# Patient Record
Sex: Female | Born: 1955 | Race: White | Hispanic: No | Marital: Single | Smoking: Former smoker
Health system: Southern US, Community
[De-identification: ages and names within clinical notes are randomized; demographics above are authoritative.]

## PROBLEM LIST (undated history)

## (undated) DIAGNOSIS — Z87442 Personal history of urinary calculi: Secondary | ICD-10-CM

## (undated) DIAGNOSIS — K219 Gastro-esophageal reflux disease without esophagitis: Secondary | ICD-10-CM

## (undated) DIAGNOSIS — C7A8 Other malignant neuroendocrine tumors: Secondary | ICD-10-CM

## (undated) HISTORY — PX: KIDNEY STONE SURGERY: SHX686

---

## 2004-04-23 ENCOUNTER — Ambulatory Visit: Payer: Self-pay | Admitting: Obstetrics & Gynecology

## 2004-06-25 ENCOUNTER — Ambulatory Visit: Payer: Self-pay | Admitting: Orthopedic Surgery

## 2005-04-05 HISTORY — PX: OTHER SURGICAL HISTORY: SHX169

## 2015-04-06 HISTORY — PX: COLONOSCOPY: SHX174

## 2016-08-10 ENCOUNTER — Encounter: Payer: Self-pay | Admitting: *Deleted

## 2016-08-17 ENCOUNTER — Encounter: Payer: Self-pay | Admitting: General Surgery

## 2016-08-17 ENCOUNTER — Ambulatory Visit (INDEPENDENT_AMBULATORY_CARE_PROVIDER_SITE_OTHER): Payer: BC Managed Care – PPO | Admitting: General Surgery

## 2016-08-17 VITALS — BP 118/62 | HR 98 | Resp 14 | Ht 66.0 in | Wt 116.0 lb

## 2016-08-17 DIAGNOSIS — C7A8 Other malignant neuroendocrine tumors: Secondary | ICD-10-CM

## 2016-08-17 DIAGNOSIS — L729 Follicular cyst of the skin and subcutaneous tissue, unspecified: Secondary | ICD-10-CM | POA: Diagnosis not present

## 2016-08-17 DIAGNOSIS — R634 Abnormal weight loss: Secondary | ICD-10-CM

## 2016-08-17 DIAGNOSIS — C3491 Malignant neoplasm of unspecified part of right bronchus or lung: Secondary | ICD-10-CM | POA: Insufficient documentation

## 2016-08-17 HISTORY — PX: OTHER SURGICAL HISTORY: SHX169

## 2016-08-17 HISTORY — DX: Other malignant neuroendocrine tumors: C7A.8

## 2016-08-17 NOTE — Progress Notes (Signed)
Patient ID: Eileen Pratt, female   DOB: Apr 21, 1955, 61 y.o.   MRN: [id number]  Chief Complaint  Patient presents with  . Other    HPI Eileen Pratt is a 61 y.o. female here today for a evaluation of multiple skin nodules under her arms, chest wall and  inguinal areas. Patient states she first noticed  them about three weeks ago. She states no pain but some itchy. Patient states she has not had a appetite in the last month. She states she has been having a sharpe pain in her belly.  Lost 20 pounds in the last three years.  She has been seen in 2007 for sebaceous cyst. HPI  No past medical history on file.  Past Surgical History:  Procedure Laterality Date  . breast aspiriations Left 2007  . COLONOSCOPY  2017    No family history on file.  Social History Social History  Substance Use Topics  . Smoking status: Never Smoker  . Smokeless tobacco: Never Used  . Alcohol use No    No Known Allergies  No current outpatient prescriptions on file.   No current facility-administered medications for this visit.     Review of Systems Review of Systems  Constitutional: Positive for appetite change.  Respiratory: Negative.   Gastrointestinal: Positive for constipation.    Blood pressure 118/62, pulse 98, resp. rate 14, height '5\' 6"'$  (1.676 m), weight 116 lb (52.6 kg).  Physical Exam Physical Exam  Constitutional: She is oriented to person, place, and time. She appears well-developed and well-nourished.  HENT:  Head:    Cardiovascular: Normal rate, regular rhythm and normal heart sounds.   Pulses:      Dorsalis pedis pulses are 2+ on the right side, and 2+ on the left side.       Posterior tibial pulses are 2+ on the right side, and 2+ on the left side.  No edema   Pulmonary/Chest: Effort normal and breath sounds normal.        Abdominal: There is no hepatosplenomegaly. There is tenderness in the right upper quadrant and epigastric area. There is no rebound. No hernia.  Hernia confirmed negative in the ventral area.    Lymphadenopathy:    She has no cervical adenopathy.    She has no axillary adenopathy.       Right: No inguinal adenopathy present.       Left: No inguinal and no supraclavicular adenopathy present.  Neurological: She is alert and oriented to person, place, and time.  Skin: Skin is warm and dry.  Right breast lower quadrant   Right shoulder  Right anterior chest  Left posterior neck Right upper quadrant  1 cm  Left upper quadrant 1 cm  epigastric area Right groin  Left groin Left lower quadrant Left postal thigh Left posterrior axilla         Data Reviewed The patient reports that she had a colonoscopy last year at [facility]. No record is found on review of care every where. Weight during examination on 08/04/2016 with Kem Kays, M.D.: 120 pounds. The patient last recorded weight of 142 pounds when examined 3 years ago with Barnett Applebaum, M.D.  Assessment    Unexplained weight loss.  Postprandial pain.  Multiple simultaneous skin nodules concerning for metastatic cancer.    Plan    The patient was amenable to excision of the dominant lesion on the right anterior chest. 10 mL of 0.5% Xylocaine with 0.25% Marcaine with 1-200,000 of epinephrine was  utilized well tolerated. This was supplemented with 3 mL 1% plain Xylocaine. The area with overlying skin was excised through elliptical incision. Examination showed a hard, solid mass consistent with a likely tumor. This was sent in formalin for histology. The deep tissue was approximated with a 3-0. The skin was closed with a running 4-0 Prolene suture. Telfa and Tegaderm dressing applied.  We'll obtain baseline laboratory studies including a CBC, comprehensive metabolic panel, CEA and CA 19-9.  Off work note for this we provided.    HPI, Physical Exam, Assessment and Plan have been scribed under the direction and in the presence of Hervey Ard, MD.  Gaspar Cola,  CMA  I have completed the exam and reviewed the above documentation for accuracy and completeness.  I agree with the above.  Haematologist has been used and any errors in dictation or transcription are unintentional.  Hervey Ard, M.D., F.A.C.S.   Robert Bellow 08/17/2016, 8:24 PM

## 2016-08-18 ENCOUNTER — Ambulatory Visit: Payer: Self-pay | Admitting: Obstetrics & Gynecology

## 2016-08-18 LAB — CANCER ANTIGEN 19-9: CA 19-9: 21 U/mL (ref 0–35)

## 2016-08-18 LAB — CBC WITH DIFFERENTIAL/PLATELET
BASOS ABS: 0 10*3/uL (ref 0.0–0.2)
BASOS: 0 %
EOS (ABSOLUTE): 0.1 10*3/uL (ref 0.0–0.4)
Eos: 0 %
HEMOGLOBIN: 15 g/dL (ref 11.1–15.9)
Hematocrit: 43.5 % (ref 34.0–46.6)
IMMATURE GRANS (ABS): 0 10*3/uL (ref 0.0–0.1)
Immature Granulocytes: 0 %
LYMPHS ABS: 2 10*3/uL (ref 0.7–3.1)
LYMPHS: 14 %
MCH: 30.8 pg (ref 26.6–33.0)
MCHC: 34.5 g/dL (ref 31.5–35.7)
MCV: 89 fL (ref 79–97)
Monocytes Absolute: 1.8 10*3/uL — ABNORMAL HIGH (ref 0.1–0.9)
Monocytes: 12 %
NEUTROS ABS: 10.7 10*3/uL — AB (ref 1.4–7.0)
Neutrophils: 74 %
PLATELETS: 326 10*3/uL (ref 150–379)
RBC: 4.87 x10E6/uL (ref 3.77–5.28)
RDW: 13.4 % (ref 12.3–15.4)
WBC: 14.6 10*3/uL — ABNORMAL HIGH (ref 3.4–10.8)

## 2016-08-18 LAB — COMPREHENSIVE METABOLIC PANEL
ALBUMIN: 4 g/dL (ref 3.6–4.8)
ALT: 25 IU/L (ref 0–32)
AST: 30 IU/L (ref 0–40)
Albumin/Globulin Ratio: 1.5 (ref 1.2–2.2)
Alkaline Phosphatase: 145 IU/L — ABNORMAL HIGH (ref 39–117)
BILIRUBIN TOTAL: 0.5 mg/dL (ref 0.0–1.2)
BUN / CREAT RATIO: 16 (ref 12–28)
BUN: 10 mg/dL (ref 8–27)
CALCIUM: 10 mg/dL (ref 8.7–10.3)
CHLORIDE: 93 mmol/L — AB (ref 96–106)
CO2: 26 mmol/L (ref 18–29)
Creatinine, Ser: 0.64 mg/dL (ref 0.57–1.00)
GFR, EST AFRICAN AMERICAN: 111 mL/min/{1.73_m2} (ref >59)
GFR, EST NON AFRICAN AMERICAN: 97 mL/min/{1.73_m2} (ref >59)
GLOBULIN, TOTAL: 2.7 g/dL (ref 1.5–4.5)
Glucose: 105 mg/dL — ABNORMAL HIGH (ref 65–99)
POTASSIUM: 5.2 mmol/L (ref 3.5–5.2)
SODIUM: 138 mmol/L (ref 134–144)
TOTAL PROTEIN: 6.7 g/dL (ref 6.0–8.5)

## 2016-08-18 LAB — CEA: CEA: 127.4 ng/mL — AB (ref 0.0–4.7)

## 2016-08-19 ENCOUNTER — Other Ambulatory Visit: Payer: Self-pay | Admitting: *Deleted

## 2016-08-19 ENCOUNTER — Telehealth: Payer: Self-pay | Admitting: *Deleted

## 2016-08-19 DIAGNOSIS — C799 Secondary malignant neoplasm of unspecified site: Secondary | ICD-10-CM

## 2016-08-19 NOTE — Telephone Encounter (Signed)
Patient called the office wanting lab results and pathology results from Tuesday, 08-17-16.  Dr. Bary Castilla notified.   The patient will be contacted once pathology results are available.

## 2016-08-20 ENCOUNTER — Other Ambulatory Visit: Payer: Self-pay

## 2016-08-20 ENCOUNTER — Telehealth: Payer: Self-pay

## 2016-08-20 DIAGNOSIS — C7989 Secondary malignant neoplasm of other specified sites: Secondary | ICD-10-CM

## 2016-08-20 NOTE — Patient Instructions (Signed)
Called patient to inform her that [facility] denied our request for PET scan.  This scan has been cancelled.  However, we were able to get approval for CT Chest W Contrast and CT ABD/Pelvis W contrast.  These have been scheduled at [place] for Tuesday 5/22 at 10: 00 am.  The patient will need to pick up oral contrast on Monday, 5/21.  She will need to be NPO for four hours prior to scan on 5/22.  Arrival time for scan appointment is 9:45 am.  Advised patient to contact office Monday if she had any questions or concerns.

## 2016-08-20 NOTE — Telephone Encounter (Signed)
Spoke with patient about setting up her PET scan. The patient is scheduled for the scan at [facility] on 08/24/16 at 8:30 am. She will arrive by 8:00 am and have noting by mouth for 6 hours prior. The patient is aware of date, time, and instructions.

## 2016-08-24 ENCOUNTER — Ambulatory Visit
Admission: RE | Admit: 2016-08-24 | Discharge: 2016-08-24 | Disposition: A | Discharge status: Home / Self Care | Payer: BC Managed Care – PPO | Source: Ambulatory Visit | Attending: General Surgery | Admitting: General Surgery

## 2016-08-24 DIAGNOSIS — K869 Disease of pancreas, unspecified: Secondary | ICD-10-CM | POA: Diagnosis not present

## 2016-08-24 DIAGNOSIS — C771 Secondary and unspecified malignant neoplasm of intrathoracic lymph nodes: Secondary | ICD-10-CM | POA: Diagnosis not present

## 2016-08-24 DIAGNOSIS — R198 Other specified symptoms and signs involving the digestive system and abdomen: Secondary | ICD-10-CM | POA: Insufficient documentation

## 2016-08-24 DIAGNOSIS — C7989 Secondary malignant neoplasm of other specified sites: Secondary | ICD-10-CM | POA: Diagnosis not present

## 2016-08-24 DIAGNOSIS — C786 Secondary malignant neoplasm of retroperitoneum and peritoneum: Secondary | ICD-10-CM | POA: Diagnosis not present

## 2016-08-24 DIAGNOSIS — C7972 Secondary malignant neoplasm of left adrenal gland: Secondary | ICD-10-CM | POA: Diagnosis not present

## 2016-08-24 DIAGNOSIS — N1330 Unspecified hydronephrosis: Secondary | ICD-10-CM | POA: Insufficient documentation

## 2016-08-24 DIAGNOSIS — C801 Malignant (primary) neoplasm, unspecified: Secondary | ICD-10-CM | POA: Diagnosis not present

## 2016-08-24 DIAGNOSIS — C7971 Secondary malignant neoplasm of right adrenal gland: Secondary | ICD-10-CM | POA: Insufficient documentation

## 2016-08-24 HISTORY — DX: Other malignant neuroendocrine tumors: C7A.8

## 2016-08-24 MED ORDER — IOPAMIDOL (ISOVUE-300) INJECTION 61%
100.0000 mL | Freq: Once | INTRAVENOUS | Status: AC | PRN
  Administered 2016-08-24: 100 mL via INTRAVENOUS

## 2016-08-25 ENCOUNTER — Encounter: Payer: Self-pay | Admitting: Hematology and Oncology

## 2016-08-25 ENCOUNTER — Inpatient Hospital Stay: Payer: BC Managed Care – PPO | Attending: Hematology and Oncology | Admitting: Hematology and Oncology

## 2016-08-25 VITALS — BP 112/71 | HR 103 | Temp 97.7°F | Ht 67.0 in | Wt 115.1 lb

## 2016-08-25 DIAGNOSIS — M79632 Pain in left forearm: Secondary | ICD-10-CM | POA: Insufficient documentation

## 2016-08-25 DIAGNOSIS — Z87891 Personal history of nicotine dependence: Secondary | ICD-10-CM | POA: Diagnosis not present

## 2016-08-25 DIAGNOSIS — C7A090 Malignant carcinoid tumor of the bronchus and lung: Secondary | ICD-10-CM

## 2016-08-25 DIAGNOSIS — R531 Weakness: Secondary | ICD-10-CM | POA: Diagnosis not present

## 2016-08-25 DIAGNOSIS — R748 Abnormal levels of other serum enzymes: Secondary | ICD-10-CM

## 2016-08-25 DIAGNOSIS — C3491 Malignant neoplasm of unspecified part of right bronchus or lung: Secondary | ICD-10-CM

## 2016-08-25 DIAGNOSIS — C7972 Secondary malignant neoplasm of left adrenal gland: Secondary | ICD-10-CM | POA: Insufficient documentation

## 2016-08-25 DIAGNOSIS — C7971 Secondary malignant neoplasm of right adrenal gland: Secondary | ICD-10-CM | POA: Diagnosis not present

## 2016-08-25 DIAGNOSIS — M898X9 Other specified disorders of bone, unspecified site: Secondary | ICD-10-CM

## 2016-08-25 DIAGNOSIS — R229 Localized swelling, mass and lump, unspecified: Secondary | ICD-10-CM

## 2016-08-25 DIAGNOSIS — C7931 Secondary malignant neoplasm of brain: Secondary | ICD-10-CM | POA: Diagnosis not present

## 2016-08-25 DIAGNOSIS — M79662 Pain in left lower leg: Secondary | ICD-10-CM

## 2016-08-25 DIAGNOSIS — R5383 Other fatigue: Secondary | ICD-10-CM | POA: Diagnosis not present

## 2016-08-25 DIAGNOSIS — R630 Anorexia: Secondary | ICD-10-CM | POA: Diagnosis not present

## 2016-08-25 DIAGNOSIS — R634 Abnormal weight loss: Secondary | ICD-10-CM | POA: Diagnosis not present

## 2016-08-25 DIAGNOSIS — T380X5A Adverse effect of glucocorticoids and synthetic analogues, initial encounter: Secondary | ICD-10-CM | POA: Insufficient documentation

## 2016-08-25 DIAGNOSIS — B370 Candidal stomatitis: Secondary | ICD-10-CM | POA: Diagnosis not present

## 2016-08-25 DIAGNOSIS — Z79899 Other long term (current) drug therapy: Secondary | ICD-10-CM | POA: Insufficient documentation

## 2016-08-25 DIAGNOSIS — C781 Secondary malignant neoplasm of mediastinum: Secondary | ICD-10-CM | POA: Diagnosis not present

## 2016-08-25 DIAGNOSIS — C7951 Secondary malignant neoplasm of bone: Secondary | ICD-10-CM | POA: Insufficient documentation

## 2016-08-25 DIAGNOSIS — R7989 Other specified abnormal findings of blood chemistry: Secondary | ICD-10-CM | POA: Insufficient documentation

## 2016-08-25 DIAGNOSIS — C7989 Secondary malignant neoplasm of other specified sites: Secondary | ICD-10-CM

## 2016-08-25 DIAGNOSIS — C7A1 Malignant poorly differentiated neuroendocrine tumors: Secondary | ICD-10-CM | POA: Insufficient documentation

## 2016-08-25 NOTE — Progress Notes (Signed)
Patient here today for CT results. 

## 2016-08-25 NOTE — Progress Notes (Signed)
[facility] day:  08/25/2016  Chief Complaint: Eileen Pratt is a 61 y.o. female with a neuroendocrine tumor who is referred in consultation by Dr. Hervey Ard for assessment and management.  HPI: The patient notes a 15 pack year smoking history (1/2 pack/day x 30 years).  She stopped smoking when her mother died.  She denies any history of COPD/emphysema.  She states that 1 year ago, she began to have pains under her rib cage which would "come and go".  Over the past 4-5 weeks, she began to have lumps pop up on her chest, head, and abdomen. The first lump that appeared was on her right anterior chest.  Lumps are non-painful and slightly pruritic.    She was seen by Dr. Kem Kays on 08/04/2016 and referred to Dr. Hervey Ard on 08/17/2016.  She underwent excision of the dominant lesion on the right anterior chest.  Biopsy of the nodule on 08/17/2016 revealed a neuroendocrine carcinoma. The tumor was positive for chromogranin, neuron specific enolase (NSE) and pancytokertin AE1/3 stains. The tumor was negative for cytokeratin 7, cytokeratin 20, Melan-A and S-100 stains. TTF-1 was diffusely positive. 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  Labs on 08/17/2016 revealed a hematocrit of 43.5, hemoglobin 15.0, MCV 89, platelets 326,000, white count 14,600 with an ANC of 10,700.  Comprehensive metabolic panel was notable for an alkaline phosphatase of 145.  CEA was 127.4.  CA 19-9 was 21.  Chest, abdomen, and pelvic CT scan on 08/24/2016 revealed extensive subcutaneous nodules in the chest, abdomen and pelvis consistent metastatic neuroendocrine tumor.  There was a small nodule in the RIGHT upper lobe and larger nodule in the RIGHT hilum consistent with PRIMARY NEOPLASM versus metastatic  disease.  There was mild nodal metastasis in the mediastinum and along the pericardium.  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  There was extensive peritoneal nodular metastasis and retroperitoneal nodule metastasis.  There was subcapsular lesions along the kidney and liver likely represents serosal metastasis.  There was mild hydronephrosis of pole of LEFT kidney is favored related to tumor within the LEFT hilum.  There was potential cortical infiltrate in the LEFT upper pole.  There was no evidence of bowel obstruction. There were multiple peritoneal lesions associated with the terminal ileum and descending colon.  There were multiple cystic lesions within the pancreas body and tail. 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  Symptomatically, she notes pain in her left forearm and left tibia without trauma for the past 3 weeks.  She has had a 20 pound weight loss in the past year.  Appetite is poor.  She denies any headache or neurologic symptoms.  She is up-to-date on her mammogram (yearly at [facility]) and colonoscopy (2 years ago).   Past Medical History:  Diagnosis Date  . 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 breast aspiriations Left 2007  . chest wall mass  08/17/2016   NEUROENDOCRINE CARCINOMA, FAVOR METASTASIS  . COLONOSCOPY  2017    History reviewed. No pertinent family history.  Social History:  reports that she has never smoked. She has never used smokeless tobacco. She reports that she does not drink alcohol or use drugs.  She has a 15 pack year smoking history (1/2 pack/day x 30 years).  She stopped  smoking when her mother died.  She has 6 brothers and no sisters.  She has no children.  She works at the [employer].  She lives in [place], [place].  The patient is alone today.  Allergies: No Known Allergies  Current Medications: No current outpatient prescriptions on file.   No  current facility-administered medications for this visit.     Review of Systems:  GENERAL:  Fatigue.  No fevers or sweats.  Weight loss of 20-25 pounds in the past year. PERFORMANCE STATUS (ECOG):  1 HEENT:  No visual changes, runny nose, sore throat, mouth sores or tenderness. Lungs: No shortness of breath or cough.  No hemoptysis. Cardiac:  No chest pain, palpitations, orthopnea, or PND. GI:  Poor appetite.  No nausea, vomiting, diarrhea, constipation, melena or hematochezia. GU:  No urgency, frequency, dysuria, or hematuria. Musculoskeletal:  Pain in left forearm and left tibia without trauma.  No muscle tenderness. Extremities:  No swelling. Skin:  No rashes or skin changes. Neuro:  No headache, numbness or weakness, balance or coordination issues. Endocrine:  No diabetes, thyroid issues, hot flashes or night sweats. Psych:  No mood changes, depression or anxiety. Pain: Pain in left forearm and tibia. Review of systems:  All other systems reviewed and found to be negative.  Physical Exam: Blood pressure 112/71, pulse (!) 103, temperature 97.7 F (36.5 C), temperature source Tympanic, weight 115 lb 1.3 oz (52.2 kg). GENERAL:  Thin woman sitting comfortably in the exam room in no acute distress. MENTAL STATUS:  Alert and oriented to person, place and time. HEAD:  Long gray hair.  Normocephalic, atraumatic, face symmetric, no Cushingoid features. EYES:  Hazel eyes.  Pupils equal round and reactive to light and accomodation.  No conjunctivitis or scleral icterus. ENT:  Oropharynx clear without lesion.  Tongue normal. Mucous membranes moist.  RESPIRATORY:  Clear to auscultation without rales, wheezes or rhonchi. CARDIOVASCULAR:  Regular rate and rhythm without murmur, rub or gallop. ABDOMEN:  Soft, non-tender, with active bowel sounds, and no hepatosplenomegaly.  No masses. SKIN:  Right upper anterior chest wall incision C/D/I.  Upper back with 1.5 cm ruddy nodule.  Two 1-1/2 cm neck  lesions.  Scalp with clustered lesions.  Four abdominal nodules.  Right groin nodule.  No rashes, ulcers or lesions. EXTREMITIES: No edema, no skin discoloration or tenderness.  No palpable cords. LYMPH NODES: No palpable cervical, supraclavicular, axillary or inguinal adenopathy  NEUROLOGICAL: Alert & oriented, cranial nerves II-XII intact; motor strength 5/5 throughout; sensation intact; finger to nose and RAM normal; able to walk heel to toe; negative Rhomberg; no clonus or Babinski.  Difficulty getting up from a squat. PSYCH:  Appropriate.   No visits with results within 3 Day(s) from this visit.  Latest known visit with results is:  Office Visit on 08/17/2016  Component Date Value Ref Range Status  . CA 19-9 08/17/2016 21  0 - 35 U/mL Final   Roche ECLIA methodology  . CEA 08/17/2016 127.4* 0.0 - 4.7 ng/mL Final   Comment:        Roche ECLIA methodology       Nonsmokers  <3.9                                      Smokers     <5.6   . Glucose 08/17/2016 105* 65 - 99 mg/dL Final  . BUN 08/17/2016 10  8 - 27 mg/dL Final  . Creatinine, Ser 08/17/2016 0.64  0.57 - 1.00 mg/dL Final  . GFR calc non Af Amer 08/17/2016 97  >59 mL/min/1.73 Final  . GFR calc Af Amer 08/17/2016 111  >59 mL/min/1.73 Final  . BUN/Creatinine Ratio 08/17/2016 16  12 - 28 Final  . Sodium 08/17/2016 138  134 - 144 mmol/L Final  . Potassium 08/17/2016 5.2  3.5 - 5.2 mmol/L Final  . Chloride 08/17/2016 93* 96 - 106 mmol/L Final  . CO2 08/17/2016 26  18 - 29 mmol/L Final  . Calcium 08/17/2016 10.0  8.7 - 10.3 mg/dL Final  . Total Protein 08/17/2016 6.7  6.0 - 8.5 g/dL Final  . Albumin 08/17/2016 4.0  3.6 - 4.8 g/dL Final  . Globulin, Total 08/17/2016 2.7  1.5 - 4.5 g/dL Final  . Albumin/Globulin Ratio 08/17/2016 1.5  1.2 - 2.2 Final  . Bilirubin Total 08/17/2016 0.5  0.0 - 1.2 mg/dL Final  . Alkaline Phosphatase 08/17/2016 145* 39 - 117 IU/L Final  . AST 08/17/2016 30  0 - 40 IU/L Final  . ALT 08/17/2016 25  0 - 32  IU/L Final  . WBC 08/17/2016 14.6* 3.4 - 10.8 x10E3/uL Final  . RBC 08/17/2016 4.87  3.77 - 5.28 x10E6/uL Final  . Hemoglobin 08/17/2016 15.0  11.1 - 15.9 g/dL Final  . Hematocrit 08/17/2016 43.5  34.0 - 46.6 % Final  . MCV 08/17/2016 89  79 - 97 fL Final  . MCH 08/17/2016 30.8  26.6 - 33.0 pg Final  . MCHC 08/17/2016 34.5  31.5 - 35.7 g/dL Final  . RDW 08/17/2016 13.4  12.3 - 15.4 % Final  . Platelets 08/17/2016 326  150 - 379 x10E3/uL Final  . Neutrophils 08/17/2016 74  Not Estab. % Final  . Lymphs 08/17/2016 14  Not Estab. % Final  . Monocytes 08/17/2016 12  Not Estab. % Final  . Eos 08/17/2016 0  Not Estab. % Final  . Basos 08/17/2016 0  Not Estab. % Final  . Neutrophils Absolute 08/17/2016 10.7* 1.4 - 7.0 x10E3/uL Final  . Lymphocytes Absolute 08/17/2016 2.0  0.7 - 3.1 x10E3/uL Final  . Monocytes Absolute 08/17/2016 1.8* 0.1 - 0.9 x10E3/uL Final  . EOS (ABSOLUTE) 08/17/2016 0.1  0.0 - 0.4 x10E3/uL Final  . Basophils Absolute 08/17/2016 0.0  0.0 - 0.2 x10E3/uL Final  . Immature Granulocytes 08/17/2016 0  Not Estab. % Final  . Immature Grans (Abs) 08/17/2016 0.0  0.0 - 0.1 x10E3/uL Final    Assessment:  Eileen Pratt is a 61 y.o. female with extensive stage small cell lung cancer.  She presented with a 20 pound weight loss in the past year and multiple SQ skin nodules.  Biopsy of the dominant right anterior chest nodule on 08/17/2016 revealed a high grade neuroendocrine carcinoma. The tumor was positive for chromogranin, neuron specific enolase (NSE) and pancytokertin AE1/3 stains. The tumor was negative for cytokeratin 7, cytokeratin 20, Melan-A and S-100 stains. TTF-1 was diffusely positive. 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  Chest, abdomen, and pelvic CT on 08/24/2016 revealed extensive subcutaneous nodules in the chest, abdomen and pelvis consistent metastatic neuroendocrine tumor.  There was a  small nodule in the RIGHT upper lobe and larger nodule in the RIGHT hilum consistent with PRIMARY NEOPLASM versus metastatic disease.  There was mild nodal metastasis in the mediastinum and along the pericardium.  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  There was  extensive peritoneal nodular metastasis and retroperitoneal nodule metastasis.  There was subcapsular lesions along the kidney and liver likely represents serosal metastasis.  There was mild hydronephrosis of pole of LEFT kidney is favored related to tumor within the LEFT hilum.  There was potential cortical infiltrate in the LEFT upper pole.  There were multiple peritoneal lesions associated with the terminal ileum and descending colon.  There were multiple cystic lesions within the pancreas body and tail.    CEA was 127.4 and CA 19-9 was 21 on 08/17/2016.  Symptomatically, she notes pain in her left forearm and left tibia for the past 3 weeks.  She has had a 20 pound weight loss in the past year.  Appetite is poor.  She denies any headache or neurologic symptoms.  Exam reveals multiple SQ nodules.  Alkaline phosphatase is elevated.  Plan: 1.  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  Discuss additional staging studies including head MRI and bone scan.  Concern for metastatic bone disease given elevated alkaline phosphatase and bone pain.  If bone scan +, will check plain films of weight bearing bones secondary to risk for fracture.  Patient would be a candidate for Xgeva and possible radiation to weight bearing bones to prevent fracture.  Discuss treatment is palliative.  Discuss treatment with carboplatin and etoposide every 3 weeks for 4-6 cycles.  Potential side effects of treatment reviewed including myelosuppression, infection, hair loss, nausea/vomiting, electrolyte wasting (potassium and magnesium), and secondary leukemia (etoposide).  Discuss chemotherapy  teaching.  Discuss need for port-a-cath placement.  2.  Schedule head MRI ASAP. 3.  Schedule bone scan ASAP. 4.  Present at tumor board tomorrow. 5.  Schedule chemotherapy teaching: carboplatin and etoposide. 6.  RTC on Tuesday or Wednsesday next week for MD assessment, labs (CBC with diff, CMP, Mg), and cycle #1 carboplatin and etoposide.  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 The largest was a 2 cm right anterior inferior frontal gyrus metastasis with surrounding cerebral edema but no significant mass effect. At least 5 additional subcentimeter brain metastases are identified without edema or mass effect.  There was indeterminate metastatic involvement of the pituitary gland.  There was widespread skull metastases, including an expansile 2 x 3 cm left vertex metastasis with underlying dural involvement. There was questionable tiny dural metastasis.  The patient was contacted on 08/27/2016 regarding the results of her head MRI.  She denied headache or neurologic symptoms.  She was started on Decadron 4 mg po q 6 hours as well as omeprazole for GI prophylaxis.  Radiation oncology was contacted.  She will be seen after [facility] (08/30/2016).   Lequita Asal, MD  08/25/2016, 3:34 PM

## 2016-08-26 ENCOUNTER — Inpatient Hospital Stay: Payer: BC Managed Care – PPO

## 2016-08-26 NOTE — Patient Instructions (Signed)
Etoposide, VP-16 injection What is this medicine? ETOPOSIDE, VP-16 (e toe POE side) is a chemotherapy drug. It is used to treat testicular cancer, lung cancer, and other cancers. This medicine may be used for other purposes; ask your health care provider or pharmacist if you have questions. COMMON BRAND NAME(S): Etopophos, Toposar, VePesid What should I tell my health care provider before I take this medicine? They need to know if you have any of these conditions: -infection -kidney disease -liver disease -low blood counts, like low white cell, platelet, or red cell counts -an unusual or allergic reaction to etoposide, other medicines, foods, dyes, or preservatives -pregnant or trying to get pregnant -breast-feeding How should I use this medicine? This medicine is for infusion into a vein. It is administered in a hospital or clinic by a specially trained health care professional. Talk to your pediatrician regarding the use of this medicine in children. Special care may be needed. Overdosage: If you think you have taken too much of this medicine contact a poison control center or emergency room at once. NOTE: This medicine is only for you. Do not share this medicine with others. What if I miss a dose? It is important not to miss your dose. Call your doctor or health care professional if you are unable to keep an appointment. What may interact with this medicine? -aspirin -certain medications for seizures like carbamazepine, phenobarbital, phenytoin, valproic acid -cyclosporine -levamisole -warfarin This list may not describe all possible interactions. Give your health care provider a list of all the medicines, herbs, non-prescription drugs, or dietary supplements you use. Also tell them if you smoke, drink alcohol, or use illegal drugs. Some items may interact with your medicine. What should I watch for while using this medicine? Visit your doctor for checks on your progress. This drug  may make you feel generally unwell. This is not uncommon, as chemotherapy can affect healthy cells as well as cancer cells. Report any side effects. Continue your course of treatment even though you feel ill unless your doctor tells you to stop. In some cases, you may be given additional medicines to help with side effects. Follow all directions for their use. Call your doctor or health care professional for advice if you get a fever, chills or sore throat, or other symptoms of a cold or flu. Do not treat yourself. This drug decreases your body's ability to fight infections. Try to avoid being around people who are sick. This medicine may increase your risk to bruise or bleed. Call your doctor or health care professional if you notice any unusual bleeding. Talk to your doctor about your risk of cancer. You may be more at risk for certain types of cancers if you take this medicine. Do not become pregnant while taking this medicine or for at least 6 months after stopping it. Women should inform their doctor if they wish to become pregnant or think they might be pregnant. Women of child-bearing potential will need to have a negative pregnancy test before starting this medicine. There is a potential for serious side effects to an unborn child. Talk to your health care professional or pharmacist for more information. Do not breast-feed an infant while taking this medicine. Men must use a latex condom during sexual contact with a woman while taking this medicine and for at least 4 months after stopping it. A latex condom is needed even if you have had a vasectomy. Contact your doctor right away if your partner becomes pregnant. Do   not donate sperm while taking this medicine and for at least 4 months after you stop taking this medicine. Men should inform their doctors if they wish to father a child. This medicine may lower sperm counts. What side effects may I notice from receiving this medicine? Side effects that  you should report to your doctor or health care professional as soon as possible: -allergic reactions like skin rash, itching or hives, swelling of the face, lips, or tongue -low blood counts - this medicine may decrease the number of white blood cells, red blood cells and platelets. You may be at increased risk for infections and bleeding. -signs of infection - fever or chills, cough, sore throat, pain or difficulty passing urine -signs of decreased platelets or bleeding - bruising, pinpoint red spots on the skin, black, tarry stools, blood in the urine -signs of decreased red blood cells - unusually weak or tired, fainting spells, lightheadedness -breathing problems -changes in vision -mouth or throat sores or ulcers -pain, redness, swelling or irritation at the injection site -pain, tingling, numbness in the hands or feet -redness, blistering, peeling or loosening of the skin, including inside the mouth -seizures -vomiting Side effects that usually do not require medical attention (report to your doctor or health care professional if they continue or are bothersome): -diarrhea -hair loss -loss of appetite -nausea -stomach pain This list may not describe all possible side effects. Call your doctor for medical advice about side effects. You may report side effects to FDA at 1-800-FDA-1088. Where should I keep my medicine? This drug is given in a hospital or clinic and will not be stored at home. NOTE: This sheet is a summary. It may not cover all possible information. If you have questions about this medicine, talk to your doctor, pharmacist, or health care provider.  2018 Elsevier/Gold Standard (2015-03-14 11:53:23) Carboplatin injection What is this medicine? CARBOPLATIN (KAR boe pla tin) is a chemotherapy drug. It targets fast dividing cells, like cancer cells, and causes these cells to die. This medicine is used to treat ovarian cancer and many other cancers. This medicine may be  used for other purposes; ask your health care provider or pharmacist if you have questions. COMMON BRAND NAME(S): Paraplatin What should I tell my health care provider before I take this medicine? They need to know if you have any of these conditions: -blood disorders -hearing problems -kidney disease -recent or ongoing radiation therapy -an unusual or allergic reaction to carboplatin, cisplatin, other chemotherapy, other medicines, foods, dyes, or preservatives -pregnant or trying to get pregnant -breast-feeding How should I use this medicine? This drug is usually given as an infusion into a vein. It is administered in a hospital or clinic by a specially trained health care professional. Talk to your pediatrician regarding the use of this medicine in children. Special care may be needed. Overdosage: If you think you have taken too much of this medicine contact a poison control center or emergency room at once. NOTE: This medicine is only for you. Do not share this medicine with others. What if I miss a dose? It is important not to miss a dose. Call your doctor or health care professional if you are unable to keep an appointment. What may interact with this medicine? -medicines for seizures -medicines to increase blood counts like filgrastim, pegfilgrastim, sargramostim -some antibiotics like amikacin, gentamicin, neomycin, streptomycin, tobramycin -vaccines Talk to your doctor or health care professional before taking any of these medicines: -acetaminophen -aspirin -ibuprofen -ketoprofen -naproxen  This list may not describe all possible interactions. Give your health care provider a list of all the medicines, herbs, non-prescription drugs, or dietary supplements you use. Also tell them if you smoke, drink alcohol, or use illegal drugs. Some items may interact with your medicine. What should I watch for while using this medicine? Your condition will be monitored carefully while you are  receiving this medicine. You will need important blood work done while you are taking this medicine. This drug may make you feel generally unwell. This is not uncommon, as chemotherapy can affect healthy cells as well as cancer cells. Report any side effects. Continue your course of treatment even though you feel ill unless your doctor tells you to stop. In some cases, you may be given additional medicines to help with side effects. Follow all directions for their use. Call your doctor or health care professional for advice if you get a fever, chills or sore throat, or other symptoms of a cold or flu. Do not treat yourself. This drug decreases your body's ability to fight infections. Try to avoid being around people who are sick. This medicine may increase your risk to bruise or bleed. Call your doctor or health care professional if you notice any unusual bleeding. Be careful brushing and flossing your teeth or using a toothpick because you may get an infection or bleed more easily. If you have any dental work done, tell your dentist you are receiving this medicine. Avoid taking products that contain aspirin, acetaminophen, ibuprofen, naproxen, or ketoprofen unless instructed by your doctor. These medicines may hide a fever. Do not become pregnant while taking this medicine. Women should inform their doctor if they wish to become pregnant or think they might be pregnant. There is a potential for serious side effects to an unborn child. Talk to your health care professional or pharmacist for more information. Do not breast-feed an infant while taking this medicine. What side effects may I notice from receiving this medicine? Side effects that you should report to your doctor or health care professional as soon as possible: -allergic reactions like skin rash, itching or hives, swelling of the face, lips, or tongue -signs of infection - fever or chills, cough, sore throat, pain or difficulty passing  urine -signs of decreased platelets or bleeding - bruising, pinpoint red spots on the skin, black, tarry stools, nosebleeds -signs of decreased red blood cells - unusually weak or tired, fainting spells, lightheadedness -breathing problems -changes in hearing -changes in vision -chest pain -high blood pressure -low blood counts - This drug may decrease the number of white blood cells, red blood cells and platelets. You may be at increased risk for infections and bleeding. -nausea and vomiting -pain, swelling, redness or irritation at the injection site -pain, tingling, numbness in the hands or feet -problems with balance, talking, walking -trouble passing urine or change in the amount of urine Side effects that usually do not require medical attention (report to your doctor or health care professional if they continue or are bothersome): -hair loss -loss of appetite -metallic taste in the mouth or changes in taste This list may not describe all possible side effects. Call your doctor for medical advice about side effects. You may report side effects to FDA at 1-800-FDA-1088. Where should I keep my medicine? This drug is given in a hospital or clinic and will not be stored at home. NOTE: This sheet is a summary. It may not cover all possible information. If you have  questions about this medicine, talk to your doctor, pharmacist, or health care provider.  2018 Elsevier/Gold Standard (2007-06-27 14:38:05)

## 2016-08-27 ENCOUNTER — Ambulatory Visit
Admission: RE | Admit: 2016-08-27 | Discharge: 2016-08-27 | Disposition: A | Discharge status: Home / Self Care | Payer: BC Managed Care – PPO | Source: Ambulatory Visit | Attending: Hematology and Oncology | Admitting: Hematology and Oncology

## 2016-08-27 ENCOUNTER — Other Ambulatory Visit: Payer: Self-pay | Admitting: Hematology and Oncology

## 2016-08-27 ENCOUNTER — Telehealth: Payer: Self-pay | Admitting: Hematology and Oncology

## 2016-08-27 DIAGNOSIS — C7931 Secondary malignant neoplasm of brain: Secondary | ICD-10-CM | POA: Diagnosis not present

## 2016-08-27 DIAGNOSIS — R748 Abnormal levels of other serum enzymes: Secondary | ICD-10-CM | POA: Insufficient documentation

## 2016-08-27 DIAGNOSIS — M898X9 Other specified disorders of bone, unspecified site: Secondary | ICD-10-CM

## 2016-08-27 DIAGNOSIS — C7951 Secondary malignant neoplasm of bone: Secondary | ICD-10-CM | POA: Insufficient documentation

## 2016-08-27 DIAGNOSIS — C7A1 Malignant poorly differentiated neuroendocrine tumors: Secondary | ICD-10-CM | POA: Diagnosis present

## 2016-08-27 MED ORDER — OMEPRAZOLE 20 MG PO CPDR: 20.0000 mg | DELAYED_RELEASE_CAPSULE | Freq: Every day | ORAL | 1 refills | Status: AC

## 2016-08-27 MED ORDER — GADOBENATE DIMEGLUMINE 529 MG/ML IV SOLN
10.0000 mL | Freq: Once | INTRAVENOUS | Status: AC | PRN
  Administered 2016-08-27: 10 mL via INTRAVENOUS

## 2016-08-27 MED ORDER — DEXAMETHASONE 4 MG PO TABS: 4.0000 mg | ORAL_TABLET | Freq: Four times a day (QID) | ORAL | 0 refills | Status: DC

## 2016-08-27 NOTE — Telephone Encounter (Signed)
Re:  Head MRI results  Called patient with head MRI results.  Patient has brain metastasis with edema.    Decadron 4 mg po q 6 hours prescribed as well as omeprazole 20 mg a day for GI prophylaxis. Side effects reviewed. Prescription sent to her pharmacy.  She has an appointment with radiation oncology on Tuesday, 08/31/2016, in the afternoon.  She is to call if she has any problems over the holiday weekend.   Lequita Asal, MD

## 2016-08-27 NOTE — Progress Notes (Signed)
START ON PATHWAY REGIMEN - Small Cell Lung     A cycle is every 21 days:     Etoposide      Carboplatin   **Always confirm dose/schedule in your pharmacy ordering system**    Patient Characteristics: Extensive Stage, First Line Stage Grouping: Extensive AJCC T Category: TX AJCC N Category: NX AJCC M Category: Staged < 8th Ed. AJCC 8 Stage Grouping: IVB Line of therapy: First Line Would you be surprised if this patient died  in the next year? I would NOT be surprised if this patient died in the next year  Intent of Therapy: Non-Curative / Palliative Intent, Discussed with Patient

## 2016-08-29 ENCOUNTER — Telehealth: Payer: Self-pay | Admitting: General Surgery

## 2016-08-29 NOTE — Telephone Encounter (Signed)
Patient taking decadron as requested for brain mets.  Meets w/ radiation oncology after Memorial Day.  Will stop by the office to get the sutures from skin nodule excision out that day after radiation appointment.  Discussed need for port.   Will look to schedule week of June 4.

## 2016-08-30 DIAGNOSIS — C7931 Secondary malignant neoplasm of brain: Secondary | ICD-10-CM | POA: Insufficient documentation

## 2016-08-30 NOTE — Progress Notes (Addendum)
[facility] day:  08/31/2016  Chief Complaint: Eileen Pratt is a 61 y.o. female with extensive stage small cell lung cancer who is seen for assessment prior to cycle #1 carboplatin and etoposide.  HPI: The patient was last seen in the medical oncology clinic clinic on 08/25/2016.  At that time, she was seen for initial consultation.  She had noted the rapid development of numerous cutaneous lesions.  Excisional biopsy on 08/17/2016 revealed high grade neuroendocrine tumor.  Tumor was TTF1+ and was consistent with small cell lung cancer.  Chest, abdomen, and pelvic CT scans on 08/25/2016 revealed extensive subcutaneous nodules, a small nodule in the right upper lobe and larger nodule in the right hilum, nodal metastasis in the mediastinum and along the pericardium.  There were bilateral adrenal metastasis, extensive peritoneal nodular metastasis and retroperitoneal nodule metastasis, subcapsular lesions along the kidney and liver likely represents serosal metastasis. There were multiple cystic lesions within the pancreas body and tail.  Head MRI on 08/27/2016 revealed brain metastases. The largest was a 2 cm right anterior inferior frontal gyrus metastasis with surrounding cerebral edema but no significant mass effect. At least 5 additional subcentimeter brain metastases are identified without edema or mass effect.  There was indeterminate metastatic involvement of the pituitary gland.  There was widespread skull metastases, including an expansile 2 x 3 cm left vertex metastasis with underlying dural involvement. There was questionable tiny dural metastasis.  She was started on Decadron 4 mg po q 6 hours on 08/27/2016.  Bone scan is scheduled for 09/02/2016.  Symptomatically, she feels weak.  She is not eating or sleeping well.   Past Medical History:  Diagnosis Date  . 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 breast aspiriations Left 2007  . chest wall mass  08/17/2016   NEUROENDOCRINE CARCINOMA, FAVOR METASTASIS  . COLONOSCOPY  2017    History reviewed. No pertinent family history.  Social History:  reports that she has never smoked. She has never used smokeless tobacco. She reports that she does not drink alcohol or use drugs.  She has a 15 pack year smoking history (1/2 pack/day x 30 years).  She stopped smoking when her mother died.  She has 6 brothers and no sisters.  She has no children.  She previously worked at the [employer].  She can no longer work.  She lives in [place], [place].  The patient is accompanied by Hoy Morn, her boyfriend of 42 years, today.  Allergies: No Known Allergies  Current Medications: Current Outpatient Prescriptions  Medication Sig Dispense Refill  . dexamethasone (DECADRON) 4 MG tablet Take 1 tablet (4 mg total) by mouth every 6 (six) hours. 40 tablet 0  . omeprazole (PRILOSEC) 20 MG capsule Take 1 capsule (20 mg total) by mouth daily. 30 capsule 1   No current facility-administered medications for this visit.     Review of Systems:  GENERAL:  Fatigue.  No fevers or sweats.  Weight loss of 4 pounds since last visit. PERFORMANCE STATUS (ECOG):  2 HEENT:  No visual changes, runny nose, sore throat, mouth sores or tenderness. Lungs: No shortness of breath or cough.  No hemoptysis. Cardiac:  No chest pain, palpitations, orthopnea, or PND. GI:  Poor appetite.  No nausea, vomiting, diarrhea, constipation, melena or hematochezia. GU:  No urgency, frequency, dysuria, or hematuria. Musculoskeletal:  Pain in left forearm and left tibia without  trauma.  No muscle tenderness. Extremities:  No swelling. Skin:  No rashes or skin changes. Neuro:  No headache, numbness or weakness, balance or coordination issues. Endocrine:  No diabetes, thyroid issues, hot flashes or night sweats. Psych:  No mood changes, depression or anxiety.   Poor sleep. Pain: Pain in left forearm and tibia. Review of systems:  All other systems reviewed and found to be negative.  Physical Exam: Blood pressure 104/65, pulse 87, temperature 97.6 F (36.4 C), temperature source Tympanic, resp. rate 18, weight 111 lb 5 oz (50.5 kg). GENERAL:  Thin woman sitting comfortably in the exam room in no acute distress. MENTAL STATUS:  Alert and oriented to person, place and time. HEAD:  Long gray hair.  Normocephalic, atraumatic, face symmetric, no Cushingoid features. EYES:  Hazel eyes.  Pupils equal round and reactive to light and accomodation.  No conjunctivitis or scleral icterus. ENT:  Thrush.  Tongue normal. Mucous membranes moist.  RESPIRATORY:  Clear to auscultation without rales, wheezes or rhonchi. CARDIOVASCULAR:  Regular rate and rhythm without murmur, rub or gallop. ABDOMEN:  Soft, non-tender, with active bowel sounds, and no hepatosplenomegaly.  No masses. SKIN: Upper back with 1.5 cm ruddy nodule.  Two 1-1/2 cm neck lesions.  Scalp with clustered lesions.  Abdominal nodules.  EXTREMITIES: No edema, no skin discoloration or tenderness.  No palpable cords. LYMPH NODES: No palpable cervical, supraclavicular, axillary or inguinal adenopathy  NEUROLOGICAL: Appropriate. PSYCH:  Appropriate.   Appointment on 08/31/2016  Component Date Value Ref Range Status  . WBC 08/31/2016 23.9* 3.6 - 11.0 K/uL Final  . RBC 08/31/2016 5.08  3.80 - 5.20 MIL/uL Final  . Hemoglobin 08/31/2016 15.6  12.0 - 16.0 g/dL Final  . HCT 08/31/2016 45.2  35.0 - 47.0 % Final  . MCV 08/31/2016 89.0  80.0 - 100.0 fL Final  . MCH 08/31/2016 30.7  26.0 - 34.0 pg Final  . MCHC 08/31/2016 34.5  32.0 - 36.0 g/dL Final  . RDW 08/31/2016 13.2  11.5 - 14.5 % Final  . Platelets 08/31/2016 449* 150 - 440 K/uL Final  . Neutrophils Relative % 08/31/2016 87  % Final  . Neutro Abs 08/31/2016 20.9* 1.4 - 6.5 K/uL Final  . Lymphocytes Relative 08/31/2016 6  % Final  . Lymphs Abs  08/31/2016 1.3  1.0 - 3.6 K/uL Final  . Monocytes Relative 08/31/2016 6  % Final  . Monocytes Absolute 08/31/2016 1.4* 0.2 - 0.9 K/uL Final  . Eosinophils Relative 08/31/2016 1  % Final  . Eosinophils Absolute 08/31/2016 0.2  0 - 0.7 K/uL Final  . Basophils Relative 08/31/2016 0  % Final  . Basophils Absolute 08/31/2016 0.0  0 - 0.1 K/uL Final  . Sodium 08/31/2016 134* 135 - 145 mmol/L Final  . Potassium 08/31/2016 5.3* 3.5 - 5.1 mmol/L Final  . Chloride 08/31/2016 96* 101 - 111 mmol/L Final  . CO2 08/31/2016 29  22 - 32 mmol/L Final  . Glucose, Bld 08/31/2016 134* 65 - 99 mg/dL Final  . BUN 08/31/2016 16  6 - 20 mg/dL Final  . Creatinine, Ser 08/31/2016 0.82  0.44 - 1.00 mg/dL Final  . Calcium 08/31/2016 10.0  8.9 - 10.3 mg/dL Final  . Total Protein 08/31/2016 7.6  6.5 - 8.1 g/dL Final  . Albumin 08/31/2016 3.6  3.5 - 5.0 g/dL Final  . AST 08/31/2016 35  15 - 41 U/L Final  . ALT 08/31/2016 23  14 - 54 U/L Final  . Alkaline Phosphatase 08/31/2016  139* 38 - 126 U/L Final  . Total Bilirubin 08/31/2016 0.4  0.3 - 1.2 mg/dL Final  . GFR calc non Af Amer 08/31/2016 >60  >60 mL/min Final  . GFR calc Af Amer 08/31/2016 >60  >60 mL/min Final   Comment: (NOTE) The eGFR has been calculated using the CKD EPI equation. This calculation has not been validated in all clinical situations. eGFR's persistently <60 mL/min signify possible Chronic Kidney Disease.   . Anion gap 08/31/2016 9  5 - 15 Final  . Magnesium 08/31/2016 2.1  1.7 - 2.4 mg/dL Final    Assessment:  ETHELINE GEPPERT is a 61 y.o. female with extensive stage small cell lung cancer.  She presented with a 20 pound weight loss in the past year and multiple SQ skin nodules.  Biopsy of the dominant right anterior chest nodule on 08/17/2016 revealed a high grade neuroendocrine carcinoma. The tumor was positive for chromogranin, neuron specific enolase (NSE) and pancytokertin AE1/3 stains. The tumor was negative for cytokeratin 7, cytokeratin  20, Melan-A and S-100 stains. TTF-1 was diffusely positive. 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  Chest, abdomen, and pelvic CT on 08/24/2016 revealed extensive subcutaneous nodules in the chest, abdomen and pelvis consistent metastatic neuroendocrine tumor.  There was a small nodule in the RIGHT upper lobe and larger nodule in the RIGHT hilum consistent with PRIMARY NEOPLASM versus metastatic disease.  There was mild nodal metastasis in the mediastinum and along the pericardium.  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  There was extensive peritoneal nodular metastasis and retroperitoneal nodule metastasis.  There was subcapsular lesions along the kidney and liver likely represents serosal metastasis.  There was mild hydronephrosis of pole of LEFT kidney is favored related to tumor within the LEFT hilum.  There was potential cortical infiltrate in the LEFT upper pole.  There were multiple peritoneal lesions associated with the terminal ileum and descending colon.  There were multiple cystic lesions within the pancreas body and tail.    Head MRI on 08/27/2016 revealed brain metastases. The largest was a 2 cm right anterior inferior frontal gyrus metastasis with surrounding cerebral edema but no significant mass effect. At least 5 additional subcentimeter brain metastases are identified without edema or mass effect.  There was indeterminate metastatic involvement of the pituitary gland.  There was widespread skull metastases, including an expansile 2 x 3 cm left vertex metastasis with underlying dural involvement. There was questionable tiny dural metastasis.  CEA was 127.4 and CA 19-9 was 21 on 08/17/2016.  Symptomatically, she has pain in her left forearm and left tibia.  She has had a 20 pounds.  Appetite is poor.  Exam reveals multiple SQ nodules.  She has thrush secondary to Decadron.  Alkaline phosphatase is  elevated.  Plan: 1.  Labs today:  CBC with diff, CMP, Mg. 2.  Discuss results of CT scan and head MRI.  She has extensive stage small cell lung cancer.  Discuss need to address CNS metastasis.  She will see Dr. Baruch Gouty today to discuss whole brain radiation.  Discuss plan for bone scan to assess bone disease.  If any weight bearing lesions +, will obtain plain films. 3.  Discuss pain management. 4.  Discuss management of thrush. 5.  No chemotherapy today. 6.  Rx: Nystatin and hydrocodone/acetaminophen. 7.  Consult with Dr. Baruch Gouty today (seen in clinic). 8.  Call patient with results of bone scan. 9.  Patient to be seen  for f/u after completion of radiation (pt to call)   Lequita Asal, MD  08/31/2016, 10:40 AM

## 2016-08-31 ENCOUNTER — Ambulatory Visit
Admission: RE | Admit: 2016-08-31 | Discharge: 2016-08-31 | Disposition: A | Discharge status: Home / Self Care | Payer: BC Managed Care – PPO | Source: Ambulatory Visit | Attending: Radiation Oncology | Admitting: Radiation Oncology

## 2016-08-31 ENCOUNTER — Inpatient Hospital Stay (HOSPITAL_BASED_OUTPATIENT_CLINIC_OR_DEPARTMENT_OTHER): Payer: BC Managed Care – PPO | Admitting: Hematology and Oncology

## 2016-08-31 ENCOUNTER — Encounter: Payer: Self-pay | Admitting: Hematology and Oncology

## 2016-08-31 ENCOUNTER — Inpatient Hospital Stay: Payer: BC Managed Care – PPO

## 2016-08-31 ENCOUNTER — Other Ambulatory Visit: Payer: Self-pay | Admitting: Hematology and Oncology

## 2016-08-31 VITALS — BP 104/65 | HR 87 | Temp 97.6°F | Resp 18 | Wt 111.3 lb

## 2016-08-31 DIAGNOSIS — R748 Abnormal levels of other serum enzymes: Secondary | ICD-10-CM

## 2016-08-31 DIAGNOSIS — Z7189 Other specified counseling: Secondary | ICD-10-CM

## 2016-08-31 DIAGNOSIS — M79662 Pain in left lower leg: Secondary | ICD-10-CM

## 2016-08-31 DIAGNOSIS — C7971 Secondary malignant neoplasm of right adrenal gland: Secondary | ICD-10-CM

## 2016-08-31 DIAGNOSIS — C7972 Secondary malignant neoplasm of left adrenal gland: Secondary | ICD-10-CM | POA: Diagnosis not present

## 2016-08-31 DIAGNOSIS — R7989 Other specified abnormal findings of blood chemistry: Secondary | ICD-10-CM | POA: Diagnosis not present

## 2016-08-31 DIAGNOSIS — C781 Secondary malignant neoplasm of mediastinum: Secondary | ICD-10-CM

## 2016-08-31 DIAGNOSIS — R531 Weakness: Secondary | ICD-10-CM | POA: Diagnosis not present

## 2016-08-31 DIAGNOSIS — C7931 Secondary malignant neoplasm of brain: Secondary | ICD-10-CM

## 2016-08-31 DIAGNOSIS — T380X5A Adverse effect of glucocorticoids and synthetic analogues, initial encounter: Secondary | ICD-10-CM

## 2016-08-31 DIAGNOSIS — R5383 Other fatigue: Secondary | ICD-10-CM | POA: Diagnosis not present

## 2016-08-31 DIAGNOSIS — C3490 Malignant neoplasm of unspecified part of unspecified bronchus or lung: Secondary | ICD-10-CM

## 2016-08-31 DIAGNOSIS — R634 Abnormal weight loss: Secondary | ICD-10-CM | POA: Diagnosis not present

## 2016-08-31 DIAGNOSIS — G893 Neoplasm related pain (acute) (chronic): Secondary | ICD-10-CM

## 2016-08-31 DIAGNOSIS — C3491 Malignant neoplasm of unspecified part of right bronchus or lung: Secondary | ICD-10-CM

## 2016-08-31 DIAGNOSIS — Z87891 Personal history of nicotine dependence: Secondary | ICD-10-CM

## 2016-08-31 DIAGNOSIS — Z79899 Other long term (current) drug therapy: Secondary | ICD-10-CM

## 2016-08-31 DIAGNOSIS — R630 Anorexia: Secondary | ICD-10-CM | POA: Diagnosis not present

## 2016-08-31 DIAGNOSIS — M79632 Pain in left forearm: Secondary | ICD-10-CM

## 2016-08-31 DIAGNOSIS — C7A1 Malignant poorly differentiated neuroendocrine tumors: Secondary | ICD-10-CM

## 2016-08-31 DIAGNOSIS — C7951 Secondary malignant neoplasm of bone: Secondary | ICD-10-CM

## 2016-08-31 DIAGNOSIS — R229 Localized swelling, mass and lump, unspecified: Secondary | ICD-10-CM

## 2016-08-31 DIAGNOSIS — C7A090 Malignant carcinoid tumor of the bronchus and lung: Secondary | ICD-10-CM | POA: Diagnosis not present

## 2016-08-31 DIAGNOSIS — M898X9 Other specified disorders of bone, unspecified site: Secondary | ICD-10-CM

## 2016-08-31 DIAGNOSIS — B370 Candidal stomatitis: Secondary | ICD-10-CM

## 2016-08-31 LAB — COMPREHENSIVE METABOLIC PANEL
ALT: 23 U/L (ref 14–54)
AST: 35 U/L (ref 15–41)
Albumin: 3.6 g/dL (ref 3.5–5.0)
Alkaline Phosphatase: 139 U/L — ABNORMAL HIGH (ref 38–126)
Anion gap: 9 (ref 5–15)
BUN: 16 mg/dL (ref 6–20)
CO2: 29 mmol/L (ref 22–32)
Calcium: 10 mg/dL (ref 8.9–10.3)
Chloride: 96 mmol/L — ABNORMAL LOW (ref 101–111)
Creatinine, Ser: 0.82 mg/dL (ref 0.44–1.00)
GFR calc Af Amer: >60 mL/min (ref >60)
GFR calc non Af Amer: >60 mL/min (ref >60)
Glucose, Bld: 134 mg/dL — ABNORMAL HIGH (ref 65–99)
Potassium: 5.3 mmol/L — ABNORMAL HIGH (ref 3.5–5.1)
Sodium: 134 mmol/L — ABNORMAL LOW (ref 135–145)
Total Bilirubin: 0.4 mg/dL (ref 0.3–1.2)
Total Protein: 7.6 g/dL (ref 6.5–8.1)

## 2016-08-31 LAB — CBC WITH DIFFERENTIAL/PLATELET
Basophils Absolute: 0 10*3/uL (ref 0–0.1)
Basophils Relative: 0 %
Eosinophils Absolute: 0.2 10*3/uL (ref 0–0.7)
Eosinophils Relative: 1 %
HCT: 45.2 % (ref 35.0–47.0)
Hemoglobin: 15.6 g/dL (ref 12.0–16.0)
Lymphocytes Relative: 6 %
Lymphs Abs: 1.3 10*3/uL (ref 1.0–3.6)
MCH: 30.7 pg (ref 26.0–34.0)
MCHC: 34.5 g/dL (ref 32.0–36.0)
MCV: 89 fL (ref 80.0–100.0)
Monocytes Absolute: 1.4 10*3/uL — ABNORMAL HIGH (ref 0.2–0.9)
Monocytes Relative: 6 %
Neutro Abs: 20.9 10*3/uL — ABNORMAL HIGH (ref 1.4–6.5)
Neutrophils Relative %: 87 %
Platelets: 449 10*3/uL — ABNORMAL HIGH (ref 150–440)
RBC: 5.08 MIL/uL (ref 3.80–5.20)
RDW: 13.2 % (ref 11.5–14.5)
WBC: 23.9 10*3/uL — ABNORMAL HIGH (ref 3.6–11.0)

## 2016-08-31 LAB — MAGNESIUM: Magnesium: 2.1 mg/dL (ref 1.7–2.4)

## 2016-08-31 MED ORDER — HYDROCODONE-ACETAMINOPHEN 5-325 MG PO TABS: 1.0000 | ORAL_TABLET | Freq: Four times a day (QID) | ORAL | 0 refills | Status: DC | PRN

## 2016-08-31 MED ORDER — NYSTATIN 100000 UNIT/ML MT SUSP: 5.0000 mL | Freq: Four times a day (QID) | OROMUCOSAL | 0 refills | Status: DC

## 2016-08-31 NOTE — Consult Note (Signed)
NEW PATIENT EVALUATION  Name: Eileen Pratt  MRN: [id number]  Date:   08/31/2016     DOB: 1955-10-22   This 61 y.o. female patient presents to the clinic for initial evaluation of stage IV lung cancer with brain metastasis.  REFERRING PHYSICIAN: No ref. provider found  CHIEF COMPLAINT: No chief complaint on file.   DIAGNOSIS: There were no encounter diagnoses.   PREVIOUS INVESTIGATIONS:  Clinical notes reviewed Pathology report reviewed CT scans of chest abdomen and pelvis as well as MRI scan of the brain reviewed Case presented at weekly tumor conference  HPI: Patient is a 61 year old female who presented with rapid progression of multiple cutaneous lesions. 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 Tumor was TTF-1 positive consistent with small cell lung cancer. CT scan showed extensive subcutaneous nodules a small nodule in the right upper lobe and larger right hilar adenopathy. There was also bilateral adrenal metastasis extensive peritoneal nodular metastasis. She had an MRI scan performed on May 25 showing multiple brain metastasis at least 6 the largest being 2 cm and right anterior-inferior frontal gyral region. There is also involvement of the pituitary gland and widespread skull metastasis and dural involvement. She has been started on Decadron is feeling improved. I been asked to evaluate the patient for possible palliative radiation therapy to her whole brain. She has no change in visual fields no focal neurologic deficits.  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    PAST SURGICAL HISTORY:  Past Surgical History:  Procedure Laterality Date  . breast aspiriations Left 2007  . chest wall mass  08/17/2016   NEUROENDOCRINE CARCINOMA, FAVOR METASTASIS  . COLONOSCOPY  2017    FAMILY HISTORY:  family history is not on file.  SOCIAL HISTORY:  reports that she has never smoked. She has never used smokeless tobacco. She reports that she does not drink alcohol or use drugs.  ALLERGIES: Patient has no known allergies.  MEDICATIONS:  Current Outpatient Prescriptions  Medication Sig Dispense Refill  . dexamethasone (DECADRON) 4 MG tablet Take 1 tablet (4 mg total) by mouth every 6 (six) hours. 40 tablet 0  . HYDROcodone-acetaminophen (NORCO) 5-325 MG tablet Take 1 tablet by mouth every 6 (six) hours as needed for moderate pain. 40 tablet 0  . nystatin (MYCOSTATIN) 100000 UNIT/ML suspension Take 5 mLs (500,000 Units total) by mouth 4 (four) times daily. Swish and spit 60 mL 0  . omeprazole (PRILOSEC) 20 MG capsule Take 1 capsule (20 mg total) by mouth daily. 30 capsule 1   No current facility-administered medications for this encounter.     ECOG PERFORMANCE STATUS:  1 - Symptomatic but completely ambulatory  REVIEW OF SYSTEMS: Patient continues to lose weight is quite weak. Patient does have subcutaneous nodules on her skull and throughout her body consistent with known diagnosis.  Patient denies any weight loss, fatigue, weakness, fever, chills or night sweats. Patient denies any loss of vision, blurred vision. Patient denies any ringing  of the ears or hearing loss. No irregular heartbeat. Patient denies heart murmur or history of fainting. Patient denies any chest pain or pain radiating to her upper extremities. Patient denies any shortness of breath, difficulty breathing at night, cough or hemoptysis. Patient denies any swelling in the lower legs. Patient denies any nausea vomiting, vomiting of blood, or coffee ground material in the vomitus. Patient denies any  stomach pain. Patient states has had normal bowel movements no significant constipation or diarrhea. Patient denies any dysuria, hematuria or significant nocturia. Patient denies any problems walking, swelling in the joints or loss  of balance. Patient denies any skin changes, loss of hair or loss of weight. Patient denies any excessive worrying or anxiety or significant depression. Patient denies any problems with insomnia. Patient denies excessive thirst, polyuria, polydipsia. Patient denies any swollen glands, patient denies easy bruising or easy bleeding. Patient denies any recent infections, allergies or URI. Patient "s visual fields have not changed significantly in recent time.    PHYSICAL EXAM: There were no vitals taken for this visit. Crude visual fields are within normal range motor sensory and DTR levels are equal and symmetric in the upper lower extremities proprioception is intact. She has multiple subcutaneous nodules throughout her entire skin. Well-developed well-nourished patient in NAD. HEENT reveals PERLA, EOMI, discs not visualized.  Oral cavity is clear. No oral mucosal lesions are identified. Neck is clear without evidence of cervical or supraclavicular adenopathy. Lungs are clear to A&P. Cardiac examination is essentially unremarkable with regular rate and rhythm without murmur rub or thrill. Abdomen is benign with no organomegaly or masses noted. Motor sensory and DTR levels are equal and symmetric in the upper and lower extremities. Cranial nerves II through XII are grossly intact. Proprioception is intact. No peripheral adenopathy or edema is identified. No motor or sensory levels are noted. Crude visual fields are within normal range.  LABORATORY DATA: Pathology reports reviewed    RADIOLOGY RESULTS: CT scan chest abdomen and pelvis as well as brain MRI scan all reviewed PET CT is pending   IMPRESSION: Widespread metastatic disease from extensive stage small cell lung cancer in 61 year old female with multiple brain metastasis  PLAN: At this time like to start whole brain radiation therapy. Would plan on delivering 3000 cGy in 10 fractions. This should also treat her subcutaneous nodules as well as  calvarium metastasis. Risks and benefits of treatment including hair loss fatigue possible cognitive decline alteration of blood counts all were discussed in detail with the patient. She seems to comprehend my treatment plan well. I have personally ordered and scheduled CT simulation for tomorrow. I discussed the case personally with medical oncology.  I would like to take this opportunity to thank you for allowing me to participate in the care of your patient.Armstead Peaks., MD

## 2016-08-31 NOTE — Progress Notes (Signed)
Patient here today for follow up regarding lung cancer with brain mets.  Here to discuss plan of treatment.  Patient states she is weak.  Not eating or sleeping well.

## 2016-08-31 NOTE — Telephone Encounter (Signed)
Spoke with patient about scheduling her port placement. The patient is scheduled for a port placement at [facility] on 09/06/16. She will pre admit by phone. Surgery instructions reviewed with the patient and she is aware of date and instructions.

## 2016-09-01 ENCOUNTER — Other Ambulatory Visit: Payer: Self-pay | Admitting: General Surgery

## 2016-09-01 ENCOUNTER — Ambulatory Visit
Admission: RE | Admit: 2016-09-01 | Discharge: 2016-09-01 | Disposition: A | Discharge status: Home / Self Care | Payer: BC Managed Care – PPO | Source: Ambulatory Visit | Attending: Radiation Oncology | Admitting: Radiation Oncology

## 2016-09-01 DIAGNOSIS — R634 Abnormal weight loss: Secondary | ICD-10-CM | POA: Insufficient documentation

## 2016-09-01 DIAGNOSIS — C7A1 Malignant poorly differentiated neuroendocrine tumors: Secondary | ICD-10-CM | POA: Insufficient documentation

## 2016-09-01 DIAGNOSIS — C7951 Secondary malignant neoplasm of bone: Secondary | ICD-10-CM | POA: Insufficient documentation

## 2016-09-01 DIAGNOSIS — R531 Weakness: Secondary | ICD-10-CM | POA: Diagnosis not present

## 2016-09-01 DIAGNOSIS — C7931 Secondary malignant neoplasm of brain: Secondary | ICD-10-CM | POA: Diagnosis present

## 2016-09-01 DIAGNOSIS — Z510 Encounter for antineoplastic radiation therapy: Secondary | ICD-10-CM | POA: Insufficient documentation

## 2016-09-01 DIAGNOSIS — C3491 Malignant neoplasm of unspecified part of right bronchus or lung: Secondary | ICD-10-CM

## 2016-09-02 ENCOUNTER — Encounter: Admission: RE | Admit: 2016-09-02 | Payer: BC Managed Care – PPO | Source: Ambulatory Visit

## 2016-09-02 ENCOUNTER — Ambulatory Visit: Admission: RE | Admit: 2016-09-02 | Payer: BC Managed Care – PPO | Source: Ambulatory Visit

## 2016-09-03 ENCOUNTER — Encounter
Admission: RE | Admit: 2016-09-03 | Discharge: 2016-09-03 | Disposition: A | Discharge status: Home / Self Care | Payer: BC Managed Care – PPO | Source: Ambulatory Visit | Attending: General Surgery | Admitting: General Surgery

## 2016-09-03 ENCOUNTER — Other Ambulatory Visit: Payer: Self-pay | Admitting: *Deleted

## 2016-09-03 DIAGNOSIS — Z7189 Other specified counseling: Secondary | ICD-10-CM

## 2016-09-03 DIAGNOSIS — B370 Candidal stomatitis: Secondary | ICD-10-CM

## 2016-09-03 DIAGNOSIS — G893 Neoplasm related pain (acute) (chronic): Secondary | ICD-10-CM

## 2016-09-03 DIAGNOSIS — C7931 Secondary malignant neoplasm of brain: Secondary | ICD-10-CM

## 2016-09-03 DIAGNOSIS — C3491 Malignant neoplasm of unspecified part of right bronchus or lung: Secondary | ICD-10-CM

## 2016-09-03 HISTORY — DX: Gastro-esophageal reflux disease without esophagitis: K21.9

## 2016-09-03 HISTORY — DX: Personal history of urinary calculi: Z87.442

## 2016-09-03 MED ORDER — NYSTATIN 100000 UNIT/ML MT SUSP: 5.0000 mL | Freq: Four times a day (QID) | OROMUCOSAL | 2 refills | Status: AC

## 2016-09-03 NOTE — Patient Instructions (Signed)
  Your procedure is scheduled on: 09-06-16 Report to Same Day Surgery 2nd floor medical mall [facility] Entrance-take elevator on left to 2nd floor.  Check in with surgery information desk.) To find out your arrival time please call [phone] between 1PM - 3PM on 09-03-16  Remember: Instructions that are not followed completely may result in serious medical risk, up to and including death, or upon the discretion of your surgeon and anesthesiologist your surgery may need to be rescheduled.    _x___ 1. Do not eat food or drink liquids after midnight. No gum chewing or hard candies.     __x__ 2. No Alcohol for 24 hours before or after surgery.   __x__3. No Smoking for 24 prior to surgery.   ____  4. Bring all medications with you on the day of surgery if instructed.    __x__ 5. Notify your doctor if there is any change in your medical condition     (cold, fever, infections).     Do not wear jewelry, make-up, hairpins, clips or nail polish.  Do not wear lotions, powders, or perfumes. You may wear deodorant.  Do not shave 48 hours prior to surgery. Men may shave face and neck.  Do not bring valuables to the hospital.    [facility] is not responsible for any belongings or valuables.               Contacts, dentures or bridgework may not be worn into surgery.  Leave your suitcase in the car. After surgery it may be brought to your room.  For patients admitted to the hospital, discharge time is determined by your                       treatment team.   Patients discharged the day of surgery will not be allowed to drive home.  You will need someone to drive you home and stay with you the night of your procedure.    Please read over the following fact sheets that you were given:      _x___ Take anti-hypertensive (unless it includes a diuretic), cardiac, seizure, asthma,     anti-reflux and psychiatric medicines. These include:  1. DECADRON  2. OMEPRAZOLE  3. TAKE AN EXTRA OMEPRAZOLE ON  Sunday NIGHT BEFORE BED  4. MAY TAKE HYDROCODONE IF NEEDED AM OF SURGERY WITH SMALL SIP OF WATER  5.  6.  ____Fleets enema or Magnesium Citrate as directed.   ____ Use CHG Soap or sage wipes as directed on instruction sheet   ____ Use inhalers on the day of surgery and bring to hospital day of surgery  ____ Stop Metformin and Janumet 2 days prior to surgery.    ____ Take 1/2 of usual insulin dose the night before surgery and none on the morning  surgery.   ____ Follow recommendations from Cardiologist, Pulmonologist or PCP regarding stopping Aspirin, Coumadin, Pllavix ,Eliquis, Effient, or Pradaxa, and Pletal.  ____Stop Anti-inflammatories such as Advil, Aleve, Ibuprofen, Motrin, Naproxen, Naprosyn, Goodies powders or aspirin products. OK to take Tylenol    ____ Stop supplements until after surgery.  .   ____ Bring C-Pap to the hospital.

## 2016-09-05 MED ORDER — CEFAZOLIN SODIUM-DEXTROSE 2-4 GM/100ML-% IV SOLN: 2.0000 g | INTRAVENOUS | Status: DC

## 2016-09-06 ENCOUNTER — Encounter
Admission: RE | Disposition: A | Discharge status: Home / Self Care | Payer: Self-pay | Source: Ambulatory Visit | Attending: General Surgery

## 2016-09-06 ENCOUNTER — Ambulatory Visit
Admission: RE | Admit: 2016-09-06 | Discharge: 2016-09-06 | Disposition: A | Discharge status: Home / Self Care | Payer: BC Managed Care – PPO | Source: Ambulatory Visit | Attending: General Surgery | Admitting: General Surgery

## 2016-09-06 ENCOUNTER — Ambulatory Visit: Payer: BC Managed Care – PPO

## 2016-09-06 ENCOUNTER — Ambulatory Visit: Payer: BC Managed Care – PPO | Admitting: Anesthesiology

## 2016-09-06 ENCOUNTER — Encounter: Payer: Self-pay | Admitting: *Deleted

## 2016-09-06 DIAGNOSIS — R634 Abnormal weight loss: Secondary | ICD-10-CM | POA: Diagnosis not present

## 2016-09-06 DIAGNOSIS — Z87891 Personal history of nicotine dependence: Secondary | ICD-10-CM | POA: Diagnosis not present

## 2016-09-06 DIAGNOSIS — R222 Localized swelling, mass and lump, trunk: Secondary | ICD-10-CM | POA: Diagnosis not present

## 2016-09-06 DIAGNOSIS — Z681 Body mass index (BMI) 19 or less, adult: Secondary | ICD-10-CM | POA: Insufficient documentation

## 2016-09-06 DIAGNOSIS — C3490 Malignant neoplasm of unspecified part of unspecified bronchus or lung: Secondary | ICD-10-CM | POA: Diagnosis not present

## 2016-09-06 DIAGNOSIS — C799 Secondary malignant neoplasm of unspecified site: Secondary | ICD-10-CM | POA: Insufficient documentation

## 2016-09-06 DIAGNOSIS — C3491 Malignant neoplasm of unspecified part of right bronchus or lung: Secondary | ICD-10-CM

## 2016-09-06 DIAGNOSIS — Z95828 Presence of other vascular implants and grafts: Secondary | ICD-10-CM

## 2016-09-06 DIAGNOSIS — K219 Gastro-esophageal reflux disease without esophagitis: Secondary | ICD-10-CM | POA: Diagnosis not present

## 2016-09-06 HISTORY — PX: PORTACATH PLACEMENT: SHX2246

## 2016-09-06 LAB — POCT I-STAT 4, (NA,K, GLUC, HGB,HCT)
Glucose, Bld: 102 mg/dL — ABNORMAL HIGH (ref 65–99)
HCT: 50 % — ABNORMAL HIGH (ref 36.0–46.0)
HEMOGLOBIN: 17 g/dL — AB (ref 12.0–15.0)
POTASSIUM: 4.2 mmol/L (ref 3.5–5.1)
Sodium: 136 mmol/L (ref 135–145)

## 2016-09-06 SURGERY — INSERTION, TUNNELED CENTRAL VENOUS DEVICE, WITH PORT
Anesthesia: General | Laterality: Left | Wound class: Clean

## 2016-09-06 MED ORDER — ONDANSETRON HCL 4 MG/2ML IJ SOLN
INTRAMUSCULAR | Status: AC
  Filled 2016-09-06: qty 2

## 2016-09-06 MED ORDER — DEXAMETHASONE SODIUM PHOSPHATE 10 MG/ML IJ SOLN
INTRAMUSCULAR | Status: AC
  Filled 2016-09-06: qty 1

## 2016-09-06 MED ORDER — PROPOFOL 500 MG/50ML IV EMUL
INTRAVENOUS | Status: AC
  Filled 2016-09-06: qty 50

## 2016-09-06 MED ORDER — FENTANYL CITRATE (PF) 100 MCG/2ML IJ SOLN
INTRAMUSCULAR | Status: AC
  Filled 2016-09-06: qty 2

## 2016-09-06 MED ORDER — PROPOFOL 10 MG/ML IV BOLUS
INTRAVENOUS | Status: DC | PRN
  Administered 2016-09-06: 50 mg via INTRAVENOUS

## 2016-09-06 MED ORDER — SODIUM CHLORIDE 0.9 % IJ SOLN
INTRAMUSCULAR | Status: DC | PRN
  Administered 2016-09-06: 20 mL via INTRAVENOUS

## 2016-09-06 MED ORDER — CEFAZOLIN SODIUM-DEXTROSE 2-4 GM/100ML-% IV SOLN
INTRAVENOUS | Status: AC
  Filled 2016-09-06: qty 100

## 2016-09-06 MED ORDER — MIDAZOLAM HCL 2 MG/2ML IJ SOLN
INTRAMUSCULAR | Status: AC
  Filled 2016-09-06: qty 2

## 2016-09-06 MED ORDER — FENTANYL CITRATE (PF) 100 MCG/2ML IJ SOLN: 25.0000 ug | INTRAMUSCULAR | Status: DC | PRN

## 2016-09-06 MED ORDER — ACETAMINOPHEN 10 MG/ML IV SOLN
INTRAVENOUS | Status: AC
  Filled 2016-09-06: qty 100

## 2016-09-06 MED ORDER — MIDAZOLAM HCL 2 MG/2ML IJ SOLN
INTRAMUSCULAR | Status: DC | PRN
  Administered 2016-09-06: 2 mg via INTRAVENOUS

## 2016-09-06 MED ORDER — PROPOFOL 500 MG/50ML IV EMUL
INTRAVENOUS | Status: DC | PRN
  Administered 2016-09-06: 150 ug/kg/min via INTRAVENOUS

## 2016-09-06 MED ORDER — SODIUM CHLORIDE 0.9 % IJ SOLN
INTRAMUSCULAR | Status: AC
  Filled 2016-09-06: qty 50

## 2016-09-06 MED ORDER — DEXAMETHASONE SODIUM PHOSPHATE 10 MG/ML IJ SOLN: INTRAMUSCULAR | Status: DC | PRN

## 2016-09-06 MED ORDER — LIDOCAINE HCL (PF) 1 % IJ SOLN
INTRAMUSCULAR | Status: AC
  Filled 2016-09-06: qty 30

## 2016-09-06 MED ORDER — LACTATED RINGERS IV SOLN
INTRAVENOUS | Status: DC
  Administered 2016-09-06: 14:00:00 via INTRAVENOUS

## 2016-09-06 MED ORDER — LIDOCAINE HCL (PF) 1 % IJ SOLN
INTRAMUSCULAR | Status: DC | PRN
Start: 2016-09-06 — End: 2016-09-06
  Administered 2016-09-06: 15 mL

## 2016-09-06 MED ORDER — LIDOCAINE HCL (CARDIAC) 20 MG/ML IV SOLN
INTRAVENOUS | Status: DC | PRN
  Administered 2016-09-06: 60 mg via INTRAVENOUS

## 2016-09-06 MED ORDER — LIDOCAINE HCL (PF) 2 % IJ SOLN
INTRAMUSCULAR | Status: AC
  Filled 2016-09-06: qty 2

## 2016-09-06 MED ORDER — ONDANSETRON HCL 4 MG/2ML IJ SOLN
INTRAMUSCULAR | Status: DC | PRN
  Administered 2016-09-06: 4 mg via INTRAVENOUS

## 2016-09-06 MED ORDER — CEFAZOLIN SODIUM-DEXTROSE 2-3 GM-% IV SOLR
INTRAVENOUS | Status: DC | PRN
  Administered 2016-09-06: 2 g via INTRAVENOUS

## 2016-09-06 MED ORDER — LACTATED RINGERS IV SOLN: INTRAVENOUS | Status: DC | PRN

## 2016-09-06 MED ORDER — PROMETHAZINE HCL 25 MG/ML IJ SOLN: 6.2500 mg | INTRAMUSCULAR | Status: DC | PRN

## 2016-09-06 MED ORDER — FENTANYL CITRATE (PF) 100 MCG/2ML IJ SOLN
INTRAMUSCULAR | Status: DC | PRN
  Administered 2016-09-06: 25 ug via INTRAVENOUS

## 2016-09-06 MED ORDER — ACETAMINOPHEN 10 MG/ML IV SOLN
INTRAVENOUS | Status: DC | PRN
  Administered 2016-09-06: 1000 mg via INTRAVENOUS

## 2016-09-06 MED ORDER — DEXAMETHASONE SODIUM PHOSPHATE 4 MG/ML IJ SOLN
INTRAMUSCULAR | Status: DC | PRN
  Administered 2016-09-06: 5 mg via INTRAVENOUS

## 2016-09-06 SURGICAL SUPPLY — 29 items
BLADE SURG 15 STRL SS SAFETY (BLADE) ×3 IMPLANT
CHLORAPREP W/TINT 26ML (MISCELLANEOUS) ×3 IMPLANT
CLOSURE WOUND 1/2 X4 (GAUZE/BANDAGES/DRESSINGS) ×1
COVER LIGHT HANDLE STERIS (MISCELLANEOUS) ×6 IMPLANT
DECANTER SPIKE VIAL GLASS SM (MISCELLANEOUS) ×3 IMPLANT
DRAPE C-ARM XRAY 36X54 (DRAPES) ×3 IMPLANT
DRAPE LAPAROTOMY TRNSV 106X77 (MISCELLANEOUS) ×3 IMPLANT
DRSG TEGADERM 2-3/8X2-3/4 SM (GAUZE/BANDAGES/DRESSINGS) ×3 IMPLANT
DRSG TEGADERM 4X4.75 (GAUZE/BANDAGES/DRESSINGS) ×3 IMPLANT
DRSG TELFA 4X3 1S NADH ST (GAUZE/BANDAGES/DRESSINGS) ×3 IMPLANT
ELECT REM PT RETURN 9FT ADLT (ELECTROSURGICAL) ×3
ELECTRODE REM PT RTRN 9FT ADLT (ELECTROSURGICAL) ×1 IMPLANT
GLOVE BIO SURGEON STRL SZ7.5 (GLOVE) ×9 IMPLANT
GLOVE INDICATOR 8.0 STRL GRN (GLOVE) ×6 IMPLANT
GOWN STRL REUS W/ TWL LRG LVL3 (GOWN DISPOSABLE) ×2 IMPLANT
GOWN STRL REUS W/TWL LRG LVL3 (GOWN DISPOSABLE) ×4
KIT PORT POWER 8FR ISP CVUE (Catheter) ×3 IMPLANT
KIT RM TURNOVER STRD PROC AR (KITS) ×3 IMPLANT
LABEL OR SOLS (LABEL) ×3 IMPLANT
NS IRRIG 500ML POUR BTL (IV SOLUTION) ×3 IMPLANT
PACK PORT-A-CATH (MISCELLANEOUS) ×3 IMPLANT
STRIP CLOSURE SKIN 1/2X4 (GAUZE/BANDAGES/DRESSINGS) ×2 IMPLANT
SUT PROLENE 3 0 SH DA (SUTURE) ×3 IMPLANT
SUT VIC AB 3-0 SH 27 (SUTURE) ×2
SUT VIC AB 3-0 SH 27X BRD (SUTURE) ×1 IMPLANT
SUT VIC AB 4-0 FS2 27 (SUTURE) ×3 IMPLANT
SWABSTK COMLB BENZOIN TINCTURE (MISCELLANEOUS) ×3 IMPLANT
SYR 10ML SLIP (SYRINGE) ×3 IMPLANT
SYRINGE 10CC LL (SYRINGE) ×3 IMPLANT

## 2016-09-06 NOTE — Op Note (Signed)
Preoperative diagnoses: Advanced lung cancer, need for central venous access.  Postoperative diagnosis: Same.  Operative procedure: Left subclavian PowerPort placement with ultrasound and fluoroscopic guidance.  Operating surgeon: Ollen Bowl, M.D.  Anesthesia: Attended local, 15 mL 1% plain Xylocaine.  Assessment blood loss: Less than 2 mL.  Clinical note: This 61 year old woman was recently diagnosed with metastatic lung cancer. Central venous access has been requested by her treating oncologist.  Operative note: The patient received Kefzol 2 g intravenously prior to surgery. The left neck and chest was prepped with ChloraPrep and draped. Ultrasound was used to confirm patency of the subclavian vein. This was slightly small secondary delayed our area this improved with the increased IV fluids and steeper Trendelenburg position. Local anesthetic was infiltrated. The vein was cannulated under ultrasound guidance. With some difficulty good flow was obtained followed by passage of guidewire and dilator. The catheter was tunneled to a pocket on the left chest below one of her metastatic deposits. The catheter easily irrigated and aspirated in this location with the patient in the supine position. The port was anchored to the pectoralis fascia with interrupted 3-0 Prolene sutures. The wound was closed with a running 3-0 Vicryl suture for the adipose layer and a running 4-0 Vicryl septic suture for the skin. Benzoin, Steri-Strips followed by Telfa and Tegaderm dressings were applied.  Report chest x-ray was completed in the recovery room. No evidence of pneumothorax.  Catheter tip at SVC/ RA junction.

## 2016-09-06 NOTE — Anesthesia Preprocedure Evaluation (Signed)
Anesthesia Evaluation  Patient identified by MRN, date of birth, ID band Patient awake    Reviewed: Allergy & Precautions, H&P , NPO status , Patient's Chart, lab work & pertinent test results, reviewed documented beta blocker date and time   History of Anesthesia Complications Negative for: history of anesthetic complications  Airway Mallampati: II  TM Distance: >3 FB Neck ROM: full    Dental  (+) Edentulous Upper, Edentulous Lower, Upper Dentures, Lower Dentures, Dental Advidsory Given   Pulmonary neg pulmonary ROS, former smoker,           Cardiovascular Exercise Tolerance: Good negative cardio ROS       Neuro/Psych negative neurological ROS  negative psych ROS   GI/Hepatic Neg liver ROS, GERD  ,  Endo/Other  negative endocrine ROS  Renal/GU Renal disease (kidney stones)  negative genitourinary   Musculoskeletal   Abdominal   Peds  Hematology negative hematology ROS (+)   Anesthesia Other Findings Past Medical History: No date: GERD (gastroesophageal reflux disease) No date: History of kidney stones     Comment: X2 08/17/2016: Neuroendocrine carcinoma ([facility])   Reproductive/Obstetrics negative OB ROS                             Anesthesia Physical Anesthesia Plan  ASA: III  Anesthesia Plan: General   Post-op Pain Management:    Induction:   Airway Management Planned:   Additional Equipment:   Intra-op Plan:   Post-operative Plan:   Informed Consent: I have reviewed the patients History and Physical, chart, labs and discussed the procedure including the risks, benefits and alternatives for the proposed anesthesia with the patient or authorized representative who has indicated his/her understanding and acceptance.   Dental Advisory Given  Plan Discussed with: Anesthesiologist, CRNA and Surgeon  Anesthesia Plan Comments:         Anesthesia Quick Evaluation

## 2016-09-06 NOTE — Anesthesia Post-op Follow-up Note (Cosign Needed)
Anesthesia QCDR form completed.        

## 2016-09-06 NOTE — H&P (Signed)
Patient with metastatic lung cancer. Central venous access requested for chemotherapy.

## 2016-09-06 NOTE — Transfer of Care (Signed)
Immediate Anesthesia Transfer of Care Note  Patient: Eileen Pratt  Procedure(s) Performed: Procedure(s): INSERTION PORT-A-CATH (Left)  Patient Location: PACU  Anesthesia Type:General  Level of Consciousness: awake, oriented and patient cooperative  Airway & Oxygen Therapy: Patient Spontanous Breathing and Patient connected to face mask oxygen  Post-op Assessment: Report given to RN, Post -op Vital signs reviewed and stable and Patient moving all extremities X 4  Post vital signs: Reviewed and stable  Last Vitals:  Vitals:   09/06/16 1339 09/06/16 1706  BP: 115/63 (!) 104/54  Pulse: 89 92  Resp: 18 12  Temp: 36.7 C 36.3 C    Last Pain:  Vitals:   09/06/16 1339  TempSrc: Oral  PainSc: 3          Complications: No apparent anesthesia complications

## 2016-09-07 ENCOUNTER — Ambulatory Visit
Admission: RE | Admit: 2016-09-07 | Discharge: 2016-09-07 | Disposition: A | Discharge status: Home / Self Care | Payer: BC Managed Care – PPO | Source: Ambulatory Visit | Attending: Radiation Oncology | Admitting: Radiation Oncology

## 2016-09-07 ENCOUNTER — Encounter: Payer: Self-pay | Admitting: General Surgery

## 2016-09-07 DIAGNOSIS — C7931 Secondary malignant neoplasm of brain: Secondary | ICD-10-CM | POA: Diagnosis present

## 2016-09-07 DIAGNOSIS — C7951 Secondary malignant neoplasm of bone: Secondary | ICD-10-CM | POA: Diagnosis not present

## 2016-09-07 DIAGNOSIS — R634 Abnormal weight loss: Secondary | ICD-10-CM | POA: Diagnosis not present

## 2016-09-07 DIAGNOSIS — C7A1 Malignant poorly differentiated neuroendocrine tumors: Secondary | ICD-10-CM | POA: Diagnosis not present

## 2016-09-07 DIAGNOSIS — R531 Weakness: Secondary | ICD-10-CM | POA: Diagnosis not present

## 2016-09-07 DIAGNOSIS — Z510 Encounter for antineoplastic radiation therapy: Secondary | ICD-10-CM | POA: Diagnosis not present

## 2016-09-08 ENCOUNTER — Ambulatory Visit
Admission: RE | Admit: 2016-09-08 | Discharge: 2016-09-08 | Disposition: A | Discharge status: Home / Self Care | Payer: BC Managed Care – PPO | Source: Ambulatory Visit | Attending: Radiation Oncology | Admitting: Radiation Oncology

## 2016-09-08 DIAGNOSIS — C7931 Secondary malignant neoplasm of brain: Secondary | ICD-10-CM | POA: Diagnosis not present

## 2016-09-09 ENCOUNTER — Ambulatory Visit
Admission: RE | Admit: 2016-09-09 | Discharge: 2016-09-09 | Disposition: A | Discharge status: Home / Self Care | Payer: BC Managed Care – PPO | Source: Ambulatory Visit | Attending: Radiation Oncology | Admitting: Radiation Oncology

## 2016-09-09 DIAGNOSIS — C7931 Secondary malignant neoplasm of brain: Secondary | ICD-10-CM | POA: Diagnosis not present

## 2016-09-09 NOTE — Anesthesia Postprocedure Evaluation (Signed)
Anesthesia Post Note  Patient: Genia Hotter  Procedure(s) Performed: Procedure(s) (LRB): INSERTION PORT-A-CATH (Left)  Patient location during evaluation: PACU Anesthesia Type: General Level of consciousness: awake and alert Pain management: pain level controlled Vital Signs Assessment: post-procedure vital signs reviewed and stable Respiratory status: spontaneous breathing, nonlabored ventilation, respiratory function stable and patient connected to nasal cannula oxygen Cardiovascular status: blood pressure returned to baseline and stable Postop Assessment: no signs of nausea or vomiting Anesthetic complications: no     Last Vitals:  Vitals:   09/06/16 1751 09/06/16 1802  BP: (!) 118/55 (!) 123/59  Pulse: 67 62  Resp: 15 17  Temp:  36.2 C    Last Pain:  Vitals:   09/07/16 0832  TempSrc:   PainSc: 2                  Molli Barrows

## 2016-09-10 ENCOUNTER — Ambulatory Visit
Admission: RE | Admit: 2016-09-10 | Discharge: 2016-09-10 | Disposition: A | Discharge status: Home / Self Care | Payer: BC Managed Care – PPO | Source: Ambulatory Visit | Attending: Radiation Oncology | Admitting: Radiation Oncology

## 2016-09-10 DIAGNOSIS — C7931 Secondary malignant neoplasm of brain: Secondary | ICD-10-CM | POA: Diagnosis not present

## 2016-09-13 ENCOUNTER — Ambulatory Visit
Admission: RE | Admit: 2016-09-13 | Discharge: 2016-09-13 | Disposition: A | Discharge status: Home / Self Care | Payer: BC Managed Care – PPO | Source: Ambulatory Visit | Attending: Radiation Oncology | Admitting: Radiation Oncology

## 2016-09-13 DIAGNOSIS — C7931 Secondary malignant neoplasm of brain: Secondary | ICD-10-CM | POA: Diagnosis not present

## 2016-09-14 ENCOUNTER — Other Ambulatory Visit: Payer: Self-pay | Admitting: *Deleted

## 2016-09-14 ENCOUNTER — Other Ambulatory Visit: Payer: Self-pay

## 2016-09-14 ENCOUNTER — Ambulatory Visit
Admission: RE | Admit: 2016-09-14 | Discharge: 2016-09-14 | Disposition: A | Discharge status: Home / Self Care | Payer: BC Managed Care – PPO | Source: Ambulatory Visit | Attending: Radiation Oncology | Admitting: Radiation Oncology

## 2016-09-14 ENCOUNTER — Telehealth: Payer: Self-pay | Admitting: *Deleted

## 2016-09-14 ENCOUNTER — Inpatient Hospital Stay: Payer: BC Managed Care – PPO | Attending: Hematology and Oncology

## 2016-09-14 DIAGNOSIS — C7A1 Malignant poorly differentiated neuroendocrine tumors: Secondary | ICD-10-CM

## 2016-09-14 DIAGNOSIS — Z87891 Personal history of nicotine dependence: Secondary | ICD-10-CM | POA: Insufficient documentation

## 2016-09-14 DIAGNOSIS — R634 Abnormal weight loss: Secondary | ICD-10-CM | POA: Insufficient documentation

## 2016-09-14 DIAGNOSIS — C7931 Secondary malignant neoplasm of brain: Secondary | ICD-10-CM | POA: Diagnosis not present

## 2016-09-14 DIAGNOSIS — Z79899 Other long term (current) drug therapy: Secondary | ICD-10-CM | POA: Insufficient documentation

## 2016-09-14 DIAGNOSIS — K219 Gastro-esophageal reflux disease without esophagitis: Secondary | ICD-10-CM | POA: Insufficient documentation

## 2016-09-14 DIAGNOSIS — M1990 Unspecified osteoarthritis, unspecified site: Secondary | ICD-10-CM | POA: Insufficient documentation

## 2016-09-14 DIAGNOSIS — Z885 Allergy status to narcotic agent status: Secondary | ICD-10-CM | POA: Insufficient documentation

## 2016-09-14 DIAGNOSIS — C781 Secondary malignant neoplasm of mediastinum: Secondary | ICD-10-CM | POA: Insufficient documentation

## 2016-09-14 DIAGNOSIS — C792 Secondary malignant neoplasm of skin: Secondary | ICD-10-CM | POA: Insufficient documentation

## 2016-09-14 DIAGNOSIS — Z5111 Encounter for antineoplastic chemotherapy: Secondary | ICD-10-CM | POA: Insufficient documentation

## 2016-09-14 DIAGNOSIS — C7971 Secondary malignant neoplasm of right adrenal gland: Secondary | ICD-10-CM | POA: Insufficient documentation

## 2016-09-14 DIAGNOSIS — Z7689 Persons encountering health services in other specified circumstances: Secondary | ICD-10-CM | POA: Insufficient documentation

## 2016-09-14 DIAGNOSIS — C7951 Secondary malignant neoplasm of bone: Secondary | ICD-10-CM | POA: Insufficient documentation

## 2016-09-14 DIAGNOSIS — C7989 Secondary malignant neoplasm of other specified sites: Secondary | ICD-10-CM

## 2016-09-14 DIAGNOSIS — R531 Weakness: Secondary | ICD-10-CM | POA: Insufficient documentation

## 2016-09-14 DIAGNOSIS — R5383 Other fatigue: Secondary | ICD-10-CM | POA: Insufficient documentation

## 2016-09-14 DIAGNOSIS — C7A090 Malignant carcinoid tumor of the bronchus and lung: Secondary | ICD-10-CM | POA: Insufficient documentation

## 2016-09-14 DIAGNOSIS — C7972 Secondary malignant neoplasm of left adrenal gland: Secondary | ICD-10-CM | POA: Insufficient documentation

## 2016-09-14 DIAGNOSIS — Z923 Personal history of irradiation: Secondary | ICD-10-CM | POA: Insufficient documentation

## 2016-09-14 DIAGNOSIS — R5381 Other malaise: Secondary | ICD-10-CM | POA: Insufficient documentation

## 2016-09-14 LAB — CBC
HEMATOCRIT: 43.3 % (ref 35.0–47.0)
Hemoglobin: 15.1 g/dL (ref 12.0–16.0)
MCH: 30.8 pg (ref 26.0–34.0)
MCHC: 34.9 g/dL (ref 32.0–36.0)
MCV: 88.3 fL (ref 80.0–100.0)
Platelets: 157 10*3/uL (ref 150–440)
RBC: 4.91 MIL/uL (ref 3.80–5.20)
RDW: 14 % (ref 11.5–14.5)
WBC: 17.1 10*3/uL — AB (ref 3.6–11.0)

## 2016-09-14 MED ORDER — DEXAMETHASONE 4 MG PO TABS: 4.0000 mg | ORAL_TABLET | Freq: Four times a day (QID) | ORAL | 0 refills | Status: DC

## 2016-09-14 MED ORDER — FLUCONAZOLE 100 MG PO TABS: 100.0000 mg | ORAL_TABLET | Freq: Every day | ORAL | 0 refills | Status: DC

## 2016-09-14 NOTE — Telephone Encounter (Signed)
Received phone call from Maudie Mercury, RN in radiation therpay dept. Pt needs RF on decadron 4 mg 1 tablet every 6 hrs. V/o obtained for RF per Dr. Mike Gip.

## 2016-09-15 ENCOUNTER — Ambulatory Visit
Admission: RE | Admit: 2016-09-15 | Discharge: 2016-09-15 | Disposition: A | Discharge status: Home / Self Care | Payer: BC Managed Care – PPO | Source: Ambulatory Visit | Attending: Radiation Oncology | Admitting: Radiation Oncology

## 2016-09-15 DIAGNOSIS — C7931 Secondary malignant neoplasm of brain: Secondary | ICD-10-CM | POA: Diagnosis not present

## 2016-09-16 ENCOUNTER — Ambulatory Visit
Admission: RE | Admit: 2016-09-16 | Discharge: 2016-09-16 | Disposition: A | Discharge status: Home / Self Care | Payer: BC Managed Care – PPO | Source: Ambulatory Visit | Attending: Radiation Oncology | Admitting: Radiation Oncology

## 2016-09-16 DIAGNOSIS — C7931 Secondary malignant neoplasm of brain: Secondary | ICD-10-CM | POA: Diagnosis not present

## 2016-09-17 ENCOUNTER — Ambulatory Visit: Payer: BC Managed Care – PPO

## 2016-09-20 ENCOUNTER — Ambulatory Visit
Admission: RE | Admit: 2016-09-20 | Discharge: 2016-09-20 | Disposition: A | Discharge status: Home / Self Care | Payer: BC Managed Care – PPO | Source: Ambulatory Visit | Attending: Radiation Oncology | Admitting: Radiation Oncology

## 2016-09-20 ENCOUNTER — Other Ambulatory Visit: Payer: Self-pay | Admitting: *Deleted

## 2016-09-20 DIAGNOSIS — C3491 Malignant neoplasm of unspecified part of right bronchus or lung: Secondary | ICD-10-CM

## 2016-09-20 DIAGNOSIS — C7931 Secondary malignant neoplasm of brain: Secondary | ICD-10-CM | POA: Diagnosis not present

## 2016-09-21 ENCOUNTER — Ambulatory Visit: Payer: BC Managed Care – PPO | Admitting: Oncology

## 2016-09-21 ENCOUNTER — Ambulatory Visit: Payer: BC Managed Care – PPO

## 2016-09-21 ENCOUNTER — Other Ambulatory Visit: Payer: BC Managed Care – PPO

## 2016-09-21 ENCOUNTER — Ambulatory Visit
Admission: RE | Admit: 2016-09-21 | Discharge: 2016-09-21 | Disposition: A | Discharge status: Home / Self Care | Payer: BC Managed Care – PPO | Source: Ambulatory Visit | Attending: Radiation Oncology | Admitting: Radiation Oncology

## 2016-09-21 DIAGNOSIS — C7931 Secondary malignant neoplasm of brain: Secondary | ICD-10-CM | POA: Diagnosis not present

## 2016-09-22 ENCOUNTER — Ambulatory Visit
Admission: RE | Admit: 2016-09-22 | Discharge: 2016-09-22 | Disposition: A | Discharge status: Home / Self Care | Payer: BC Managed Care – PPO | Source: Ambulatory Visit | Attending: Radiation Oncology | Admitting: Radiation Oncology

## 2016-09-22 DIAGNOSIS — C7931 Secondary malignant neoplasm of brain: Secondary | ICD-10-CM | POA: Diagnosis not present

## 2016-09-23 ENCOUNTER — Other Ambulatory Visit: Payer: Self-pay | Admitting: *Deleted

## 2016-09-24 ENCOUNTER — Encounter: Admission: RE | Admit: 2016-09-24 | Payer: BC Managed Care – PPO | Source: Ambulatory Visit

## 2016-09-24 ENCOUNTER — Encounter
Admission: RE | Admit: 2016-09-24 | Discharge: 2016-09-24 | Disposition: A | Discharge status: Home / Self Care | Payer: BC Managed Care – PPO | Source: Ambulatory Visit | Attending: Hematology and Oncology | Admitting: Hematology and Oncology

## 2016-09-24 ENCOUNTER — Encounter: Payer: Self-pay | Admitting: Hematology and Oncology

## 2016-09-24 ENCOUNTER — Other Ambulatory Visit: Payer: Self-pay | Admitting: Hematology and Oncology

## 2016-09-24 ENCOUNTER — Other Ambulatory Visit: Payer: Self-pay | Admitting: *Deleted

## 2016-09-24 DIAGNOSIS — C7951 Secondary malignant neoplasm of bone: Secondary | ICD-10-CM | POA: Insufficient documentation

## 2016-09-24 DIAGNOSIS — C7A1 Malignant poorly differentiated neuroendocrine tumors: Secondary | ICD-10-CM | POA: Insufficient documentation

## 2016-09-24 DIAGNOSIS — R748 Abnormal levels of other serum enzymes: Secondary | ICD-10-CM | POA: Insufficient documentation

## 2016-09-24 DIAGNOSIS — M898X9 Other specified disorders of bone, unspecified site: Secondary | ICD-10-CM | POA: Insufficient documentation

## 2016-09-24 DIAGNOSIS — C7989 Secondary malignant neoplasm of other specified sites: Secondary | ICD-10-CM

## 2016-09-24 MED ORDER — DEXAMETHASONE 4 MG PO TABS: 4.0000 mg | ORAL_TABLET | Freq: Three times a day (TID) | ORAL | 0 refills | Status: AC

## 2016-09-24 MED ORDER — TECHNETIUM TC 99M MEDRONATE IV KIT
23.0800 | PACK | Freq: Once | INTRAVENOUS | Status: AC | PRN
  Administered 2016-09-24: 23.08 via INTRAVENOUS

## 2016-09-25 ENCOUNTER — Other Ambulatory Visit: Payer: Self-pay | Admitting: Hematology and Oncology

## 2016-09-27 ENCOUNTER — Telehealth: Payer: Self-pay | Admitting: *Deleted

## 2016-09-27 ENCOUNTER — Other Ambulatory Visit: Payer: Self-pay | Admitting: *Deleted

## 2016-09-27 DIAGNOSIS — C7A1 Malignant poorly differentiated neuroendocrine tumors: Secondary | ICD-10-CM

## 2016-09-27 DIAGNOSIS — C7989 Secondary malignant neoplasm of other specified sites: Secondary | ICD-10-CM

## 2016-09-27 DIAGNOSIS — C3491 Malignant neoplasm of unspecified part of right bronchus or lung: Secondary | ICD-10-CM

## 2016-09-27 DIAGNOSIS — C7951 Secondary malignant neoplasm of bone: Secondary | ICD-10-CM

## 2016-09-27 NOTE — Telephone Encounter (Signed)
Discussed areas of concern on bone scan with patient and instructed patient that the MD wanted to order xrays of arm and femur, voiced understanding and instructed patient where to go to have these done. Pt will do them on Wednesday after chemotherapy.

## 2016-09-28 NOTE — Progress Notes (Signed)
Eileen Pratt  Telephone:([phone]) [phone] Fax:(336) [phone]  ID: Eileen Pratt OB: January 24, 1956  MR#: [id number]  GYI#:[id number]  Patient Care Team: System, Provider Not In as PCP - General Lorenza Evangelist, MD (Endocrinology) Bary Castilla Forest Gleason, MD (General Surgery)  Chief Complaint: Eileen Pratt is a 61 y.o. female with extensive stage small cell lung cancer who is seen for assessment and to begin cycle #1 carboplatin and etoposide.  HPI: The patient was last seen in the medical oncology clinic clinic on 08/25/2016.  At that time, she was seen for initial consultation.  She had noted the rapid development of numerous cutaneous lesions.  Excisional biopsy on 08/17/2016 revealed high grade neuroendocrine tumor.  Tumor was TTF1+ and was consistent with small cell lung cancer.  Chest, abdomen, and pelvic CT scans on 08/25/2016 revealed extensive subcutaneous nodules, a small nodule in the right upper lobe and larger nodule in the right hilum, nodal metastasis in the mediastinum and along the pericardium.  There were bilateral adrenal metastasis, extensive peritoneal nodular metastasis and retroperitoneal nodule metastasis, subcapsular lesions along the kidney and liver likely represents serosal metastasis. There were multiple cystic lesions within the pancreas body and tail.  Head MRI on 08/27/2016 revealed brain metastases. The largest was a 2 cm right anterior inferior frontal gyrus metastasis with surrounding cerebral edema but no significant mass effect. At least 5 additional subcentimeter brain metastases are identified without edema or mass effect.  There was indeterminate metastatic involvement of the pituitary gland.  There was widespread skull metastases, including an expansile 2 x 3 cm left vertex metastasis with underlying dural involvement. There was questionable tiny dural metastasis.  She was started on Decadron 4 mg po q 6 hours on 08/27/2016.  Bone scan is  scheduled for 09/02/2016.  Symptomatically, she feels weak. Her legs are progressively getting weaker. She finished radiation. She continues to not eat or sleep well. She has had significant weight loss since her last visit. She is down 14 lbs in one month.   REVIEW OF SYSTEMS:   Review of Systems  Constitutional: Positive for malaise/fatigue and weight loss.       14 lb weight loss in one month  HENT: Negative.   Eyes: Negative.   Respiratory: Negative.   Cardiovascular: Negative.   Gastrointestinal: Negative.   Genitourinary: Negative.   Musculoskeletal: Negative.   Skin: Negative.   Neurological: Positive for weakness. Negative for dizziness.  Endo/Heme/Allergies: Negative.   Psychiatric/Behavioral: Negative.     As per HPI. Otherwise, a complete review of systems is negative.  PAST MEDICAL HISTORY: Past Medical History:  Diagnosis Date  . GERD (gastroesophageal reflux disease)   . History of kidney stones    X2  . 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 breast aspiriations Left 2007  . chest wall mass  08/17/2016   NEUROENDOCRINE CARCINOMA, FAVOR METASTASIS  . COLONOSCOPY  2017  . KIDNEY STONE SURGERY    . PORTACATH PLACEMENT Left 09/06/2016   Procedure: INSERTION PORT-A-CATH;  Surgeon: Robert Bellow, MD;  Location: [facility] ORS;  Service: General;  Laterality: Left;    FAMILY HISTORY: No family history on file.  ADVANCED DIRECTIVES (Y/N):  N  HEALTH MAINTENANCE: Social History  Substance Use Topics  . Smoking status: Former Smoker    Packs/day: 0.50    Years: 20.00    Types: Cigarettes    Quit date: 09/03/2009  . Smokeless tobacco:  Never Used  . Alcohol use No     Colonoscopy:  PAP:  Bone density:  Lipid panel:  Allergies  Allergen Reactions  . Percocet [Oxycodone-Acetaminophen] Other (See Comments)    CHEST PAIN-TOLERATES HYDROCODONE FINE    Current Outpatient  Prescriptions  Medication Sig Dispense Refill  . dexamethasone (DECADRON) 4 MG tablet Take 1 tablet (4 mg total) by mouth 3 (three) times daily. 40 tablet 0  . fluconazole (DIFLUCAN) 100 MG tablet Take 1 tablet (100 mg total) by mouth daily. 5 tablet 0  . HYDROcodone-acetaminophen (NORCO) 5-325 MG tablet Take 1 tablet by mouth every 6 (six) hours as needed for moderate pain. 40 tablet 0  . nystatin (MYCOSTATIN) 100000 UNIT/ML suspension Take 5 mLs (500,000 Units total) by mouth 4 (four) times daily. Swish and spit 120 mL 2  . omeprazole (PRILOSEC) 20 MG capsule Take 1 capsule (20 mg total) by mouth daily. (Patient taking differently: Take 20 mg by mouth daily before breakfast. ) 30 capsule 1  . lidocaine-prilocaine (EMLA) cream Apply to affected area once (Patient not taking: Reported on 09/29/2016) 30 g 3  . megestrol (MEGACE) 20 MG tablet Take 1 tablet (20 mg total) by mouth daily. 30 tablet 1  . ondansetron (ZOFRAN) 8 MG tablet Take 1 tablet (8 mg total) by mouth 2 (two) times daily as needed for refractory nausea / vomiting. Start on day 3 after carboplatin chemo. (Patient not taking: Reported on 09/29/2016) 30 tablet 1  . prochlorperazine (COMPAZINE) 10 MG tablet Take 1 tablet (10 mg total) by mouth every 6 (six) hours as needed (Nausea or vomiting). (Patient not taking: Reported on 09/29/2016) 30 tablet 1   No current facility-administered medications for this visit.     OBJECTIVE: Vitals:   09/29/16 1007  BP: 125/83  Pulse: 84  Resp: 20  Temp: (!) 96.3 F (35.7 C)     Body mass index is 15.24 kg/m.    ECOG FS:1 - Symptomatic but completely ambulatory  General: Thin, no acute distress. Eyes: Pink conjunctiva, anicteric sclera. Lungs: Clear to auscultation bilaterally. Heart: Regular rate and rhythm. No rubs, murmurs, or gallops. Abdomen: Soft, nontender, nondistended. No organomegaly noted, normoactive bowel sounds. Musculoskeletal: No edema, cyanosis, or clubbing. Neuro: Alert,  answering all questions appropriately. Cranial nerves grossly intact. Skin: No rashes or petechiae noted. Psych: Normal affect.   LAB RESULTS:  Lab Results  Component Value Date   NA 131 (L) 09/29/2016   K 3.9 09/29/2016   CL 97 (L) 09/29/2016   CO2 24 09/29/2016   GLUCOSE 154 (H) 09/29/2016   BUN 21 (H) 09/29/2016   CREATININE 0.68 09/29/2016   CALCIUM 9.0 09/29/2016   PROT 6.2 (L) 09/29/2016   ALBUMIN 3.4 (L) 09/29/2016   AST 34 09/29/2016   ALT 19 09/29/2016   ALKPHOS 106 09/29/2016   BILITOT 0.8 09/29/2016   GFRNONAA >60 09/29/2016   GFRAA >60 09/29/2016    Lab Results  Component Value Date   WBC 17.5 (H) 09/29/2016   NEUTROABS 16.2 (H) 09/29/2016   HGB 15.6 09/29/2016   HCT 44.7 09/29/2016   MCV 88.1 09/29/2016   PLT 192 09/29/2016     STUDIES: Dg Forearm Left  Result Date: 09/29/2016 CLINICAL DATA:  Metastatic neuroendocrine tumor with multifocal areas of abnormal bone uptake on prior bone scan. EXAM: LEFT FOREARM - 2 VIEW COMPARISON:  09/24/2016 FINDINGS: There is a ill-defined mottled appearance of the mid left radius corresponding to the area of abnormal bony uptake on prior  bone scan. Findings concerning for metastatic focus. No fracture. IMPRESSION: Poorly defined mottled area in the proximal to mid left humeral shaft concerning for metastasis. Electronically Signed   By: Rolm Baptise M.D.   On: 09/29/2016 15:08   Nm Bone Scan Whole Body  Result Date: 09/24/2016 CLINICAL DATA:  Metastatic neuroendocrine tumor. EXAM: NUCLEAR MEDICINE WHOLE BODY BONE SCAN TECHNIQUE: Whole body anterior and posterior images were obtained approximately 3 hours after intravenous injection of radiopharmaceutical. RADIOPHARMACEUTICALS:  23.08 mCi Technetium-41m MDP IV COMPARISON:  Brain MRI 5/25/ 18 and CT chest abdomen and pelvis from 08/24/2016 FINDINGS: There is diffuse heterogeneous radiotracer activity throughout the calvarium corresponding to calvarial metastasis identified on  recent brain MRI. Focal area of asymmetric increased radiotracer uptake corresponding to the proximal left forearm is identified. Medium size focus of mild increased radiotracer activity localizing to the mid shaft of the left femur is identified. Small focus of moderate increased uptake localizing to the medial aspect of the left humeral head is noted. There is heterogeneous cortical based uptake along the lateral aspect of bilateral proximal tibiae. Physiologic tracer activity is seen within both kidneys an urinary bladder. IMPRESSION: Multifocal areas of abnormal increased uptake within the calvarium, left upper extremity and left femur are identified compatible with bone metastases. Suggest plain film correlation with imaging of the left forearm, and left femur. Increased uptake within bilateral proximal tibia noted which may represent areas of metastatic disease. Hypertrophic pulmonary osteoarthritis of the is also a differential consideration with lower extremity increase cortical uptake, particularly in this patient who has evidence of pulmonary malignancy and emphysema. Suggest radiographic correlation with plain film radiographs of both tibiae. Electronically Signed   By: Kerby Moors M.D.   On: 09/24/2016 15:16   Dg Chest Port 1 View  Result Date: 09/06/2016 CLINICAL DATA:  61 year old female with Port-A-Cath placement. Initial encounter. EXAM: PORTABLE CHEST 1 VIEW COMPARISON:  08/24/2016 CT. FINDINGS: Left MediPort catheter tip proximal right atrium level. Apical bullae without pneumothorax. Right hilar mass as noted on recent CT. Heart size within normal limits Aortic calcification. IMPRESSION: Left MediPort catheter tip proximal right atrium level. Apical bullae without pneumothorax. Right hilar mass as noted on recent CT. Electronically Signed   By: Eileen Del M.D.   On: 09/06/2016 17:24   Dg C-arm 1-60 Min-no Report  Result Date: 09/06/2016 Fluoroscopy was utilized by the requesting  physician.  No radiographic interpretation.   Dg Femur Min 2 Views Left  Result Date: 09/29/2016 CLINICAL DATA:  History of malignancies. EXAM: LEFT FEMUR 2 VIEWS COMPARISON:  Bone scan 09/24/2016. FINDINGS: Very subtle lucency in the central portion of the left femur in the region of bone scan abnormality cannot be excluded. No prominent abnormality identified. There is no evidence of pathologic fracture. IMPRESSION: Very subtle lucency in the region of previously identified bone scan abnormality within the mid left femur cannot be excluded. No prominent focal abnormality identified . No evidence of pathologic fracture. Electronically Signed   By: [place]   On: 09/29/2016 15:10   Assessment:  GERTHA LICHTENBERG is a 61 y.o. female with extensive stage small cell lung cancer.  She presented with a 20 pound weight loss in the past year and multiple SQ skin nodules.  Biopsy of the dominant right anterior chest nodule on 08/17/2016 revealed a high grade neuroendocrine carcinoma. The tumor was positive for chromogranin, neuron specific enolase (NSE) and pancytokertin AE1/3 stains. The tumor was negative for cytokeratin 7, cytokeratin 20, Melan-A and  S-100 stains. TTF-1 was diffusely positive. 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  Chest, abdomen, and pelvic CT on 08/24/2016 revealed extensive subcutaneous nodules in the chest, abdomen and pelvis consistent metastatic neuroendocrine tumor.  There was a small nodule in the RIGHT upper lobe and larger nodule in the RIGHT hilum consistent with PRIMARY NEOPLASM versus metastatic disease.  There was mild nodal metastasis in the mediastinum and along the pericardium.  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  There was extensive peritoneal nodular metastasis and retroperitoneal nodule metastasis.  There was subcapsular lesions along the kidney and liver likely represents serosal  metastasis.  There was mild hydronephrosis of pole of LEFT kidney is favored related to tumor within the LEFT hilum.  There was potential cortical infiltrate in the LEFT upper pole.  There were multiple peritoneal lesions associated with the terminal ileum and descending colon.  There were multiple cystic lesions within the pancreas body and tail.    Head MRI on 08/27/2016 revealed brain metastases. The largest was a 2 cm right anterior inferior frontal gyrus metastasis with surrounding cerebral edema but no significant mass effect. At least 5 additional subcentimeter brain metastases are identified without edema or mass effect.  There was indeterminate metastatic involvement of the pituitary gland.  There was widespread skull metastases, including an expansile 2 x 3 cm left vertex metastasis with underlying dural involvement. There was questionable tiny dural metastasis.  CEA was 127.4 and CA 19-9 was 21 on 08/17/2016.  Symptomatically, she feels tired and her legs are progressively feeling weaker. She has lost 14lbs in one month. Appetite is poor.   Plan: 1. Labs today:  CBC with diff, CMP 2. Proceed with Chemo today. Will require neulasta. All questions answered.  3. Significant Weight loss: Dietary consult placed. RX Megace 20 mg daily.  4. Return to clinic in 2 weeks for laboratory work and further evaluation and then in 3 weeks for consideration of cycle 2.  Patient expressed understanding and was in agreement with this plan. She also understands that She can call clinic at any time with any questions, concerns, or complaints.   Cancer Staging Small cell lung cancer, right [facility]) Staging form: Lung, AJCC 8th Edition - Clinical stage from 08/24/2016: Stage IVB (cT1c, cN2, pM1c) - Tonye Becket, NP  Patient was seen and evaluated independently and I agree with the assessment and plan as dictated above.  Lloyd Huger, MD 10/02/16 9:41 AM

## 2016-09-29 ENCOUNTER — Ambulatory Visit
Admission: RE | Admit: 2016-09-29 | Discharge: 2016-09-29 | Disposition: A | Discharge status: Home / Self Care | Payer: BC Managed Care – PPO | Source: Ambulatory Visit | Attending: Hematology and Oncology | Admitting: Hematology and Oncology

## 2016-09-29 ENCOUNTER — Ambulatory Visit: Payer: BC Managed Care – PPO

## 2016-09-29 ENCOUNTER — Other Ambulatory Visit: Payer: Self-pay | Admitting: Hematology and Oncology

## 2016-09-29 ENCOUNTER — Inpatient Hospital Stay: Payer: BC Managed Care – PPO

## 2016-09-29 ENCOUNTER — Inpatient Hospital Stay (HOSPITAL_BASED_OUTPATIENT_CLINIC_OR_DEPARTMENT_OTHER): Payer: BC Managed Care – PPO | Admitting: Oncology

## 2016-09-29 VITALS — BP 125/83 | HR 84 | Temp 96.3°F | Resp 20 | Wt 97.3 lb

## 2016-09-29 DIAGNOSIS — C792 Secondary malignant neoplasm of skin: Secondary | ICD-10-CM | POA: Diagnosis not present

## 2016-09-29 DIAGNOSIS — C781 Secondary malignant neoplasm of mediastinum: Secondary | ICD-10-CM | POA: Diagnosis not present

## 2016-09-29 DIAGNOSIS — B370 Candidal stomatitis: Secondary | ICD-10-CM

## 2016-09-29 DIAGNOSIS — Z792 Long term (current) use of antibiotics: Secondary | ICD-10-CM

## 2016-09-29 DIAGNOSIS — C7931 Secondary malignant neoplasm of brain: Secondary | ICD-10-CM

## 2016-09-29 DIAGNOSIS — C7989 Secondary malignant neoplasm of other specified sites: Secondary | ICD-10-CM | POA: Diagnosis present

## 2016-09-29 DIAGNOSIS — Z7189 Other specified counseling: Secondary | ICD-10-CM

## 2016-09-29 DIAGNOSIS — Z923 Personal history of irradiation: Secondary | ICD-10-CM

## 2016-09-29 DIAGNOSIS — Z7689 Persons encountering health services in other specified circumstances: Secondary | ICD-10-CM | POA: Diagnosis not present

## 2016-09-29 DIAGNOSIS — C7A1 Malignant poorly differentiated neuroendocrine tumors: Secondary | ICD-10-CM

## 2016-09-29 DIAGNOSIS — Z87891 Personal history of nicotine dependence: Secondary | ICD-10-CM | POA: Diagnosis not present

## 2016-09-29 DIAGNOSIS — C7951 Secondary malignant neoplasm of bone: Secondary | ICD-10-CM | POA: Insufficient documentation

## 2016-09-29 DIAGNOSIS — R531 Weakness: Secondary | ICD-10-CM

## 2016-09-29 DIAGNOSIS — C7971 Secondary malignant neoplasm of right adrenal gland: Secondary | ICD-10-CM

## 2016-09-29 DIAGNOSIS — K219 Gastro-esophageal reflux disease without esophagitis: Secondary | ICD-10-CM

## 2016-09-29 DIAGNOSIS — C3491 Malignant neoplasm of unspecified part of right bronchus or lung: Secondary | ICD-10-CM

## 2016-09-29 DIAGNOSIS — Z5111 Encounter for antineoplastic chemotherapy: Secondary | ICD-10-CM | POA: Diagnosis not present

## 2016-09-29 DIAGNOSIS — R634 Abnormal weight loss: Secondary | ICD-10-CM | POA: Diagnosis not present

## 2016-09-29 DIAGNOSIS — C7A090 Malignant carcinoid tumor of the bronchus and lung: Secondary | ICD-10-CM | POA: Diagnosis not present

## 2016-09-29 DIAGNOSIS — R5383 Other fatigue: Secondary | ICD-10-CM | POA: Diagnosis not present

## 2016-09-29 DIAGNOSIS — R5381 Other malaise: Secondary | ICD-10-CM

## 2016-09-29 DIAGNOSIS — G893 Neoplasm related pain (acute) (chronic): Secondary | ICD-10-CM

## 2016-09-29 DIAGNOSIS — C7972 Secondary malignant neoplasm of left adrenal gland: Secondary | ICD-10-CM

## 2016-09-29 DIAGNOSIS — Z79899 Other long term (current) drug therapy: Secondary | ICD-10-CM

## 2016-09-29 DIAGNOSIS — Z885 Allergy status to narcotic agent status: Secondary | ICD-10-CM

## 2016-09-29 DIAGNOSIS — M1990 Unspecified osteoarthritis, unspecified site: Secondary | ICD-10-CM

## 2016-09-29 DIAGNOSIS — Z7951 Long term (current) use of inhaled steroids: Secondary | ICD-10-CM

## 2016-09-29 LAB — COMPREHENSIVE METABOLIC PANEL
ALT: 19 U/L (ref 14–54)
AST: 34 U/L (ref 15–41)
Albumin: 3.4 g/dL — ABNORMAL LOW (ref 3.5–5.0)
Alkaline Phosphatase: 106 U/L (ref 38–126)
Anion gap: 10 (ref 5–15)
BUN: 21 mg/dL — ABNORMAL HIGH (ref 6–20)
CO2: 24 mmol/L (ref 22–32)
Calcium: 9 mg/dL (ref 8.9–10.3)
Chloride: 97 mmol/L — ABNORMAL LOW (ref 101–111)
Creatinine, Ser: 0.68 mg/dL (ref 0.44–1.00)
GFR calc Af Amer: >60 mL/min (ref >60)
GFR calc non Af Amer: >60 mL/min (ref >60)
Glucose, Bld: 154 mg/dL — ABNORMAL HIGH (ref 65–99)
Potassium: 3.9 mmol/L (ref 3.5–5.1)
Sodium: 131 mmol/L — ABNORMAL LOW (ref 135–145)
Total Bilirubin: 0.8 mg/dL (ref 0.3–1.2)
Total Protein: 6.2 g/dL — ABNORMAL LOW (ref 6.5–8.1)

## 2016-09-29 LAB — CBC WITH DIFFERENTIAL/PLATELET
Basophils Absolute: 0 10*3/uL (ref 0–0.1)
Basophils Relative: 0 %
Eosinophils Absolute: 0 10*3/uL (ref 0–0.7)
Eosinophils Relative: 0 %
HCT: 44.7 % (ref 35.0–47.0)
Hemoglobin: 15.6 g/dL (ref 12.0–16.0)
Lymphocytes Relative: 2 %
Lymphs Abs: 0.4 10*3/uL — ABNORMAL LOW (ref 1.0–3.6)
MCH: 30.7 pg (ref 26.0–34.0)
MCHC: 34.9 g/dL (ref 32.0–36.0)
MCV: 88.1 fL (ref 80.0–100.0)
Monocytes Absolute: 0.8 10*3/uL (ref 0.2–0.9)
Monocytes Relative: 5 %
Neutro Abs: 16.2 10*3/uL — ABNORMAL HIGH (ref 1.4–6.5)
Neutrophils Relative %: 93 %
Platelets: 192 10*3/uL (ref 150–440)
RBC: 5.08 MIL/uL (ref 3.80–5.20)
RDW: 14.5 % (ref 11.5–14.5)
WBC: 17.5 10*3/uL — ABNORMAL HIGH (ref 3.6–11.0)

## 2016-09-29 LAB — MAGNESIUM: Magnesium: 2.1 mg/dL (ref 1.7–2.4)

## 2016-09-29 MED ORDER — SODIUM CHLORIDE 0.9 % IV SOLN: 429.5000 mg | Freq: Once | INTRAVENOUS | Status: DC

## 2016-09-29 MED ORDER — HYDROCODONE-ACETAMINOPHEN 5-325 MG PO TABS: 1.0000 | ORAL_TABLET | Freq: Four times a day (QID) | ORAL | 0 refills | Status: AC | PRN

## 2016-09-29 MED ORDER — SODIUM CHLORIDE 0.9 % IV SOLN
382.0000 mg | Freq: Once | INTRAVENOUS | Status: AC
  Administered 2016-09-29: 380 mg via INTRAVENOUS
  Filled 2016-09-29: qty 38

## 2016-09-29 MED ORDER — SODIUM CHLORIDE 0.9 % IV SOLN
100.0000 mg/m2 | Freq: Once | INTRAVENOUS | Status: AC
  Administered 2016-09-29: 140 mg via INTRAVENOUS
  Filled 2016-09-29: qty 7

## 2016-09-29 MED ORDER — ONDANSETRON HCL 8 MG PO TABS: 8.0000 mg | ORAL_TABLET | Freq: Two times a day (BID) | ORAL | 1 refills | Status: DC | PRN

## 2016-09-29 MED ORDER — SODIUM CHLORIDE 0.9% FLUSH
10.0000 mL | INTRAVENOUS | Status: DC | PRN
  Administered 2016-09-29: 10 mL via INTRAVENOUS
  Filled 2016-09-29: qty 10

## 2016-09-29 MED ORDER — HYDROCODONE-ACETAMINOPHEN 5-325 MG PO TABS: 1.0000 | ORAL_TABLET | Freq: Four times a day (QID) | ORAL | 0 refills | Status: DC | PRN

## 2016-09-29 MED ORDER — DEXAMETHASONE SODIUM PHOSPHATE 10 MG/ML IJ SOLN
10.0000 mg | Freq: Once | INTRAMUSCULAR | Status: AC
  Administered 2016-09-29: 10 mg via INTRAVENOUS
  Filled 2016-09-29: qty 1

## 2016-09-29 MED ORDER — HEPARIN SOD (PORK) LOCK FLUSH 100 UNIT/ML IV SOLN
500.0000 [IU] | Freq: Once | INTRAVENOUS | Status: AC
Start: 2016-09-29 — End: 2016-09-29
  Administered 2016-09-29: 500 [IU] via INTRAVENOUS

## 2016-09-29 MED ORDER — SODIUM CHLORIDE 0.9 % IV SOLN: 10.0000 mg | Freq: Once | INTRAVENOUS | Status: DC

## 2016-09-29 MED ORDER — PROCHLORPERAZINE MALEATE 10 MG PO TABS: 10.0000 mg | ORAL_TABLET | Freq: Four times a day (QID) | ORAL | 1 refills | Status: DC | PRN

## 2016-09-29 MED ORDER — MEGESTROL ACETATE 20 MG PO TABS: 20.0000 mg | ORAL_TABLET | Freq: Every day | ORAL | 1 refills | Status: AC

## 2016-09-29 MED ORDER — SODIUM CHLORIDE 0.9 % IV SOLN
Freq: Once | INTRAVENOUS | Status: AC
  Administered 2016-09-29: 11:00:00 via INTRAVENOUS
  Filled 2016-09-29: qty 1000

## 2016-09-29 MED ORDER — SODIUM CHLORIDE 0.9 % IV SOLN: 100.0000 mg/m2 | Freq: Once | INTRAVENOUS | Status: DC

## 2016-09-29 MED ORDER — PALONOSETRON HCL INJECTION 0.25 MG/5ML
0.2500 mg | Freq: Once | INTRAVENOUS | Status: AC
  Administered 2016-09-29: 0.25 mg via INTRAVENOUS
  Filled 2016-09-29: qty 5

## 2016-09-29 MED ORDER — LIDOCAINE-PRILOCAINE 2.5-2.5 % EX CREA
TOPICAL_CREAM | CUTANEOUS | 3 refills | Status: DC
Start: 2016-09-29 — End: 2016-10-09

## 2016-09-29 NOTE — Progress Notes (Signed)
Patient reports weakness today.

## 2016-09-30 ENCOUNTER — Inpatient Hospital Stay: Payer: BC Managed Care – PPO

## 2016-09-30 VITALS — BP 120/70 | HR 77 | Temp 97.1°F | Resp 18

## 2016-09-30 DIAGNOSIS — C7A1 Malignant poorly differentiated neuroendocrine tumors: Secondary | ICD-10-CM

## 2016-09-30 DIAGNOSIS — C7A090 Malignant carcinoid tumor of the bronchus and lung: Secondary | ICD-10-CM | POA: Diagnosis not present

## 2016-09-30 LAB — CEA: CEA: 440 ng/mL — ABNORMAL HIGH (ref 0.0–4.7)

## 2016-09-30 MED ORDER — HEPARIN SOD (PORK) LOCK FLUSH 100 UNIT/ML IV SOLN
500.0000 [IU] | Freq: Once | INTRAVENOUS | Status: AC
  Administered 2016-09-30: 500 [IU] via INTRAVENOUS

## 2016-09-30 MED ORDER — SODIUM CHLORIDE 0.9 % IV SOLN
100.0000 mg/m2 | Freq: Once | INTRAVENOUS | Status: AC
  Administered 2016-09-30: 140 mg via INTRAVENOUS
  Filled 2016-09-30: qty 7

## 2016-09-30 MED ORDER — DEXAMETHASONE SODIUM PHOSPHATE 10 MG/ML IJ SOLN
10.0000 mg | Freq: Once | INTRAMUSCULAR | Status: AC
  Administered 2016-09-30: 10 mg via INTRAVENOUS
  Filled 2016-09-30: qty 1

## 2016-09-30 MED ORDER — SODIUM CHLORIDE 0.9% FLUSH
10.0000 mL | INTRAVENOUS | Status: DC | PRN
  Administered 2016-09-30: 10 mL via INTRAVENOUS
  Filled 2016-09-30: qty 10

## 2016-09-30 MED ORDER — SODIUM CHLORIDE 0.9 % IV SOLN
Freq: Once | INTRAVENOUS | Status: AC
  Administered 2016-09-30: 14:00:00 via INTRAVENOUS
  Filled 2016-09-30: qty 1000

## 2016-10-01 ENCOUNTER — Inpatient Hospital Stay: Payer: BC Managed Care – PPO

## 2016-10-01 DIAGNOSIS — C7A090 Malignant carcinoid tumor of the bronchus and lung: Secondary | ICD-10-CM | POA: Diagnosis not present

## 2016-10-01 DIAGNOSIS — C7A1 Malignant poorly differentiated neuroendocrine tumors: Secondary | ICD-10-CM

## 2016-10-01 MED ORDER — SODIUM CHLORIDE 0.9 % IV SOLN
Freq: Once | INTRAVENOUS | Status: AC
  Administered 2016-10-01: 13:00:00 via INTRAVENOUS
  Filled 2016-10-01: qty 1000

## 2016-10-01 MED ORDER — HEPARIN SOD (PORK) LOCK FLUSH 100 UNIT/ML IV SOLN
500.0000 [IU] | Freq: Once | INTRAVENOUS | Status: AC | PRN
  Administered 2016-10-01: 500 [IU]

## 2016-10-01 MED ORDER — PEGFILGRASTIM 6 MG/0.6ML ~~LOC~~ PSKT
6.0000 mg | PREFILLED_SYRINGE | Freq: Once | SUBCUTANEOUS | Status: AC
  Administered 2016-10-01: 6 mg via SUBCUTANEOUS

## 2016-10-01 MED ORDER — SODIUM CHLORIDE 0.9% FLUSH
10.0000 mL | INTRAVENOUS | Status: DC | PRN
  Administered 2016-10-01: 10 mL
  Filled 2016-10-01: qty 10

## 2016-10-01 MED ORDER — DEXAMETHASONE SODIUM PHOSPHATE 10 MG/ML IJ SOLN
10.0000 mg | Freq: Once | INTRAMUSCULAR | Status: AC
  Administered 2016-10-01: 10 mg via INTRAVENOUS

## 2016-10-01 MED ORDER — SODIUM CHLORIDE 0.9 % IV SOLN
100.0000 mg/m2 | Freq: Once | INTRAVENOUS | Status: AC
  Administered 2016-10-01: 140 mg via INTRAVENOUS
  Filled 2016-10-01: qty 7

## 2016-10-07 ENCOUNTER — Other Ambulatory Visit: Payer: Self-pay | Admitting: Oncology

## 2016-10-07 ENCOUNTER — Encounter: Payer: Self-pay | Admitting: Oncology

## 2016-10-07 ENCOUNTER — Inpatient Hospital Stay
Admission: EM | Admit: 2016-10-07 | Discharge: 2016-11-03 | DRG: 871 | Disposition: E | Payer: BC Managed Care – PPO | Attending: Internal Medicine | Admitting: Internal Medicine

## 2016-10-07 ENCOUNTER — Other Ambulatory Visit: Payer: Self-pay

## 2016-10-07 ENCOUNTER — Telehealth: Payer: Self-pay | Admitting: Oncology

## 2016-10-07 ENCOUNTER — Encounter: Payer: Self-pay | Admitting: Emergency Medicine

## 2016-10-07 DIAGNOSIS — Z87442 Personal history of urinary calculi: Secondary | ICD-10-CM | POA: Diagnosis not present

## 2016-10-07 DIAGNOSIS — C7900 Secondary malignant neoplasm of unspecified kidney and renal pelvis: Secondary | ICD-10-CM | POA: Diagnosis present

## 2016-10-07 DIAGNOSIS — C779 Secondary and unspecified malignant neoplasm of lymph node, unspecified: Secondary | ICD-10-CM | POA: Diagnosis not present

## 2016-10-07 DIAGNOSIS — I959 Hypotension, unspecified: Secondary | ICD-10-CM | POA: Diagnosis present

## 2016-10-07 DIAGNOSIS — R627 Adult failure to thrive: Secondary | ICD-10-CM | POA: Diagnosis not present

## 2016-10-07 DIAGNOSIS — C7951 Secondary malignant neoplasm of bone: Secondary | ICD-10-CM | POA: Diagnosis present

## 2016-10-07 DIAGNOSIS — C3491 Malignant neoplasm of unspecified part of right bronchus or lung: Secondary | ICD-10-CM | POA: Diagnosis not present

## 2016-10-07 DIAGNOSIS — C7931 Secondary malignant neoplasm of brain: Secondary | ICD-10-CM | POA: Diagnosis present

## 2016-10-07 DIAGNOSIS — E860 Dehydration: Secondary | ICD-10-CM | POA: Diagnosis not present

## 2016-10-07 DIAGNOSIS — B3781 Candidal esophagitis: Secondary | ICD-10-CM | POA: Diagnosis present

## 2016-10-07 DIAGNOSIS — Z681 Body mass index (BMI) 19 or less, adult: Secondary | ICD-10-CM | POA: Diagnosis not present

## 2016-10-07 DIAGNOSIS — Z85118 Personal history of other malignant neoplasm of bronchus and lung: Secondary | ICD-10-CM

## 2016-10-07 DIAGNOSIS — R4702 Dysphasia: Secondary | ICD-10-CM | POA: Diagnosis present

## 2016-10-07 DIAGNOSIS — D61810 Antineoplastic chemotherapy induced pancytopenia: Secondary | ICD-10-CM | POA: Diagnosis present

## 2016-10-07 DIAGNOSIS — Z87891 Personal history of nicotine dependence: Secondary | ICD-10-CM | POA: Diagnosis not present

## 2016-10-07 DIAGNOSIS — R1310 Dysphagia, unspecified: Secondary | ICD-10-CM

## 2016-10-07 DIAGNOSIS — R531 Weakness: Secondary | ICD-10-CM

## 2016-10-07 DIAGNOSIS — C7971 Secondary malignant neoplasm of right adrenal gland: Secondary | ICD-10-CM

## 2016-10-07 DIAGNOSIS — Z7952 Long term (current) use of systemic steroids: Secondary | ICD-10-CM | POA: Diagnosis not present

## 2016-10-07 DIAGNOSIS — C772 Secondary and unspecified malignant neoplasm of intra-abdominal lymph nodes: Secondary | ICD-10-CM | POA: Diagnosis present

## 2016-10-07 DIAGNOSIS — C7972 Secondary malignant neoplasm of left adrenal gland: Secondary | ICD-10-CM | POA: Diagnosis not present

## 2016-10-07 DIAGNOSIS — A419 Sepsis, unspecified organism: Principal | ICD-10-CM | POA: Diagnosis present

## 2016-10-07 DIAGNOSIS — C781 Secondary malignant neoplasm of mediastinum: Secondary | ICD-10-CM | POA: Diagnosis present

## 2016-10-07 DIAGNOSIS — C786 Secondary malignant neoplasm of retroperitoneum and peritoneum: Secondary | ICD-10-CM | POA: Diagnosis not present

## 2016-10-07 DIAGNOSIS — Z9221 Personal history of antineoplastic chemotherapy: Secondary | ICD-10-CM

## 2016-10-07 DIAGNOSIS — Z885 Allergy status to narcotic agent status: Secondary | ICD-10-CM

## 2016-10-07 DIAGNOSIS — Z66 Do not resuscitate: Secondary | ICD-10-CM | POA: Diagnosis present

## 2016-10-07 DIAGNOSIS — Z515 Encounter for palliative care: Secondary | ICD-10-CM | POA: Diagnosis not present

## 2016-10-07 DIAGNOSIS — Z79899 Other long term (current) drug therapy: Secondary | ICD-10-CM

## 2016-10-07 DIAGNOSIS — K219 Gastro-esophageal reflux disease without esophagitis: Secondary | ICD-10-CM | POA: Diagnosis present

## 2016-10-07 DIAGNOSIS — R6251 Failure to thrive (child): Secondary | ICD-10-CM | POA: Insufficient documentation

## 2016-10-07 DIAGNOSIS — R634 Abnormal weight loss: Secondary | ICD-10-CM

## 2016-10-07 DIAGNOSIS — C787 Secondary malignant neoplasm of liver and intrahepatic bile duct: Secondary | ICD-10-CM | POA: Diagnosis present

## 2016-10-07 DIAGNOSIS — Z7189 Other specified counseling: Secondary | ICD-10-CM | POA: Insufficient documentation

## 2016-10-07 DIAGNOSIS — C7A090 Malignant carcinoid tumor of the bronchus and lung: Secondary | ICD-10-CM

## 2016-10-07 DIAGNOSIS — E861 Hypovolemia: Secondary | ICD-10-CM | POA: Diagnosis present

## 2016-10-07 DIAGNOSIS — R5381 Other malaise: Secondary | ICD-10-CM

## 2016-10-07 DIAGNOSIS — R5081 Fever presenting with conditions classified elsewhere: Secondary | ICD-10-CM | POA: Diagnosis present

## 2016-10-07 DIAGNOSIS — R64 Cachexia: Secondary | ICD-10-CM | POA: Diagnosis present

## 2016-10-07 DIAGNOSIS — B370 Candidal stomatitis: Secondary | ICD-10-CM | POA: Diagnosis present

## 2016-10-07 DIAGNOSIS — D696 Thrombocytopenia, unspecified: Secondary | ICD-10-CM | POA: Diagnosis present

## 2016-10-07 DIAGNOSIS — T451X5S Adverse effect of antineoplastic and immunosuppressive drugs, sequela: Secondary | ICD-10-CM

## 2016-10-07 DIAGNOSIS — R5383 Other fatigue: Secondary | ICD-10-CM

## 2016-10-07 DIAGNOSIS — E43 Unspecified severe protein-calorie malnutrition: Secondary | ICD-10-CM | POA: Diagnosis present

## 2016-10-07 DIAGNOSIS — T451X5A Adverse effect of antineoplastic and immunosuppressive drugs, initial encounter: Secondary | ICD-10-CM | POA: Diagnosis present

## 2016-10-07 DIAGNOSIS — R630 Anorexia: Secondary | ICD-10-CM

## 2016-10-07 DIAGNOSIS — D709 Neutropenia, unspecified: Secondary | ICD-10-CM | POA: Diagnosis present

## 2016-10-07 LAB — CBC WITH DIFFERENTIAL/PLATELET
BASOS ABS: 0 10*3/uL (ref 0–0.1)
Basophils Relative: 0 %
EOS ABS: 0 10*3/uL (ref 0–0.7)
Eosinophils Relative: 2 %
HCT: 43.2 % (ref 35.0–47.0)
HEMOGLOBIN: 14.8 g/dL (ref 12.0–16.0)
LYMPHS PCT: 79 %
Lymphs Abs: 0.2 10*3/uL — ABNORMAL LOW (ref 1.0–3.6)
MCH: 29.6 pg (ref 26.0–34.0)
MCHC: 34.3 g/dL (ref 32.0–36.0)
MCV: 86.4 fL (ref 80.0–100.0)
MONOS PCT: 6 %
Monocytes Absolute: 0 10*3/uL — ABNORMAL LOW (ref 0.2–0.9)
NEUTROS PCT: 13 %
Neutro Abs: 0 10*3/uL — ABNORMAL LOW (ref 1.4–6.5)
Platelets: 20 10*3/uL — CL (ref 150–440)
RBC: 4.99 MIL/uL (ref 3.80–5.20)
RDW: 14.3 % (ref 11.5–14.5)
WBC: 0.2 10*3/uL — AB (ref 3.6–11.0)

## 2016-10-07 LAB — COMPREHENSIVE METABOLIC PANEL
ALBUMIN: 3.2 g/dL — AB (ref 3.5–5.0)
ALT: 19 U/L (ref 14–54)
AST: 21 U/L (ref 15–41)
Alkaline Phosphatase: 93 U/L (ref 38–126)
Anion gap: 14 (ref 5–15)
BUN: 46 mg/dL — ABNORMAL HIGH (ref 6–20)
CHLORIDE: 101 mmol/L (ref 101–111)
CO2: 21 mmol/L — AB (ref 22–32)
CREATININE: 0.78 mg/dL (ref 0.44–1.00)
Calcium: 9.2 mg/dL (ref 8.9–10.3)
GFR calc non Af Amer: >60 mL/min (ref >60)
GLUCOSE: 201 mg/dL — AB (ref 65–99)
Potassium: 4 mmol/L (ref 3.5–5.1)
SODIUM: 136 mmol/L (ref 135–145)
Total Bilirubin: 1.4 mg/dL — ABNORMAL HIGH (ref 0.3–1.2)
Total Protein: 6.8 g/dL (ref 6.5–8.1)

## 2016-10-07 MED ORDER — MORPHINE SULFATE (PF) 2 MG/ML IV SOLN
2.0000 mg | INTRAVENOUS | Status: DC | PRN
  Administered 2016-10-07: 2 mg via INTRAVENOUS
  Filled 2016-10-07: qty 1

## 2016-10-07 MED ORDER — MORPHINE SULFATE (PF) 2 MG/ML IV SOLN
2.0000 mg | INTRAVENOUS | Status: AC
  Administered 2016-10-07: 2 mg via INTRAVENOUS
  Filled 2016-10-07: qty 1

## 2016-10-07 MED ORDER — ACETAMINOPHEN 325 MG PO TABS: 650.0000 mg | ORAL_TABLET | Freq: Four times a day (QID) | ORAL | Status: DC | PRN

## 2016-10-07 MED ORDER — MAGIC MOUTHWASH
10.0000 mL | Freq: Four times a day (QID) | ORAL | Status: DC
  Filled 2016-10-07 (×2): qty 10

## 2016-10-07 MED ORDER — FLUCONAZOLE IN SODIUM CHLORIDE 200-0.9 MG/100ML-% IV SOLN
200.0000 mg | INTRAVENOUS | Status: DC
  Administered 2016-10-07: 200 mg via INTRAVENOUS
  Filled 2016-10-07 (×2): qty 100

## 2016-10-07 MED ORDER — POTASSIUM CHLORIDE IN NACL 20-0.9 MEQ/L-% IV SOLN
INTRAVENOUS | Status: DC
  Administered 2016-10-07: 16:00:00 via INTRAVENOUS
  Filled 2016-10-07 (×4): qty 1000

## 2016-10-07 MED ORDER — LIDOCAINE VISCOUS 2 % MT SOLN
10.0000 mL | Freq: Four times a day (QID) | OROMUCOSAL | Status: DC
  Filled 2016-10-07: qty 15

## 2016-10-07 MED ORDER — BISACODYL 10 MG RE SUPP
10.0000 mg | Freq: Every day | RECTAL | Status: DC | PRN
  Filled 2016-10-07: qty 1

## 2016-10-07 MED ORDER — SODIUM CHLORIDE 0.9 % IV BOLUS (SEPSIS)
1000.0000 mL | Freq: Once | INTRAVENOUS | Status: AC
  Administered 2016-10-07: 1000 mL via INTRAVENOUS

## 2016-10-07 MED ORDER — ENOXAPARIN SODIUM 40 MG/0.4ML ~~LOC~~ SOLN: 40.0000 mg | SUBCUTANEOUS | Status: DC

## 2016-10-07 MED ORDER — ONDANSETRON HCL 4 MG/2ML IJ SOLN: 4.0000 mg | Freq: Four times a day (QID) | INTRAMUSCULAR | Status: DC | PRN

## 2016-10-07 MED ORDER — SODIUM CHLORIDE 0.9 % IV BOLUS (SEPSIS)
1000.0000 mL | Freq: Once | INTRAVENOUS | Status: AC
  Administered 2016-10-07: 14:00:00 1000 mL via INTRAVENOUS

## 2016-10-07 MED ORDER — ACETAMINOPHEN 650 MG RE SUPP: 650.0000 mg | Freq: Four times a day (QID) | RECTAL | Status: DC | PRN

## 2016-10-07 MED ORDER — FLEET ENEMA 7-19 GM/118ML RE ENEM
1.0000 | ENEMA | Freq: Once | RECTAL | Status: DC | PRN
Start: 2016-10-07 — End: 2016-10-08

## 2016-10-07 MED ORDER — ALBUTEROL SULFATE (2.5 MG/3ML) 0.083% IN NEBU: 2.5000 mg | INHALATION_SOLUTION | RESPIRATORY_TRACT | Status: DC | PRN

## 2016-10-07 MED ORDER — POLYETHYLENE GLYCOL 3350 17 G PO PACK: 17.0000 g | PACK | Freq: Every day | ORAL | Status: DC | PRN

## 2016-10-07 MED ORDER — MORPHINE SULFATE (PF) 2 MG/ML IV SOLN
2.0000 mg | INTRAVENOUS | Status: DC | PRN
  Administered 2016-10-07 – 2016-10-08 (×5): 2 mg via INTRAVENOUS
  Filled 2016-10-07 (×5): qty 1

## 2016-10-07 MED ORDER — ONDANSETRON HCL 4 MG/2ML IJ SOLN
8.0000 mg | Freq: Four times a day (QID) | INTRAMUSCULAR | Status: DC | PRN
  Filled 2016-10-07: qty 4

## 2016-10-07 MED ORDER — MAGIC MOUTHWASH W/LIDOCAINE
10.0000 mL | Freq: Four times a day (QID) | ORAL | Status: DC
  Filled 2016-10-07 (×3): qty 10

## 2016-10-07 NOTE — ED Provider Notes (Signed)
[facility] Emergency Department Provider Note  ____________________________________________   First MD Initiated Contact with Patient 10/27/2016 1045     (approximate)  I have reviewed the triage vital signs and the nursing notes.   HISTORY  Chief Complaint Dysphagia   HPI Eileen Pratt is a 61 y.o. female with a history of stage IV lung cancer with a living will was presenting to the emergency department today with an inability to swallow over the past day. She is accompanied by her boyfriend and brother. The brother is also the POA. The patient is describing pain to her throat as well as an inability to swallow. She has also experienced a hoarse voice over the past one day. As of 2 days ago she was able to swallow and have limited amount of oral intake. However, over the past 3 months the patient has lost about 50 pounds.    Past Medical History:  Diagnosis Date  . GERD (gastroesophageal reflux disease)   . History of kidney stones    X2  . 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 Brain metastasis ([place]) 08/30/2016  . 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 Loss of weight 08/17/2016  . Skin cyst 08/17/2016  . Small cell lung cancer, right ([place]) 08/17/2016    Past Surgical History:  Procedure Laterality Date  . breast aspiriations Left 2007  . chest wall mass  08/17/2016   NEUROENDOCRINE CARCINOMA, FAVOR METASTASIS  . COLONOSCOPY  2017  . KIDNEY STONE SURGERY    . PORTACATH PLACEMENT Left 09/06/2016   Procedure: INSERTION PORT-A-CATH;  Surgeon: Robert Bellow, MD;  Location: [facility] ORS;  Service: General;  Laterality: Left;    Prior to Admission medications   Medication Sig Start Date End Date Taking? Authorizing Provider  dexamethasone  (DECADRON) 4 MG tablet Take 1 tablet (4 mg total) by mouth 3 (three) times daily. 09/24/16   Consuello Bossier, MD  fluconazole (DIFLUCAN) 100 MG tablet Take 1 tablet (100 mg total) by mouth daily. 09/14/16   Noreene Filbert, MD  HYDROcodone-acetaminophen (NORCO) 5-325 MG tablet Take 1 tablet by mouth every 6 (six) hours as needed for moderate pain. 09/29/16   Jacquelin Hawking, NP  lidocaine-prilocaine (EMLA) cream Apply to affected area once Patient not taking: Reported on 09/29/2016 09/29/16   Lloyd Huger, MD  megestrol (MEGACE) 20 MG tablet Take 1 tablet (20 mg total) by mouth daily. 09/29/16   Jacquelin Hawking, NP  nystatin (MYCOSTATIN) 100000 UNIT/ML suspension Take 5 mLs (500,000 Units total) by mouth 4 (four) times daily. Swish and spit 09/03/16   Lequita Asal, MD  omeprazole (PRILOSEC) 20 MG capsule Take 1 capsule (20 mg total) by mouth daily. Patient taking differently: Take 20 mg by mouth daily before breakfast.  08/27/16   Lequita Asal, MD  ondansetron (ZOFRAN) 8 MG tablet Take 1 tablet (8 mg total) by mouth 2 (two) times daily as needed for refractory nausea / vomiting. Start on day 3 after carboplatin chemo. Patient not taking: Reported on 09/29/2016 09/29/16   Lloyd Huger, MD  prochlorperazine (COMPAZINE) 10 MG tablet Take 1 tablet (10 mg total) by mouth every 6 (six) hours as needed (Nausea or vomiting). Patient not taking: Reported on 09/29/2016 09/29/16   Lloyd Huger, MD  Allergies Percocet [oxycodone-acetaminophen]  History reviewed. No pertinent family history.  Social History Social History  Substance Use Topics  . Smoking status: Former Smoker    Packs/day: 0.50    Years: 20.00    Types: Cigarettes    Quit date: 09/03/2009  . Smokeless tobacco: Never Used  . Alcohol use No    Review of Systems  Constitutional: No fever/chills Eyes: No visual changes. ENT: No sore throat. Cardiovascular: Denies chest pain. Respiratory: Denies  shortness of breath. Gastrointestinal: No abdominal pain.  No nausea, no vomiting.  No diarrhea.  No constipation. Genitourinary: Negative for dysuria. Musculoskeletal: Negative for back pain. Skin: Negative for rash. Neurological: Negative for headaches, focal weakness or numbness.   ____________________________________________   PHYSICAL EXAM:  VITAL SIGNS: ED Triage Vitals  Enc Vitals Group     BP 10/05/2016 1042 (!) 82/58     Pulse Rate 11/01/2016 1042 (!) 132     Resp 10/24/2016 1042 15     Temp 11/02/2016 1042 98.8 F (37.1 C)     Temp Source 10/05/2016 1042 Oral     SpO2 10/12/2016 1042 99 %     Weight 10/19/2016 1047 89 lb (40.4 kg)     Height --      Head Circumference --      Peak Flow --      Pain Score --      Pain Loc --      Pain Edu? --      Excl. in [place]? --     Constitutional: Alert . Patient is cachectic and ill-appearing. Eyes: Conjunctivae are normal.  Head: Atraumatic. Nose: No congestion/rhinnorhea. Mouth/Throat: Mucous membranes are moist.  Thrush visualized in the posterior pharynx. Hoarse voice but able to phonate although minimally. Neck: No stridor.   Cardiovascular: Tachycardic, regular rhythm. Grossly normal heart sounds.   Respiratory: Normal respiratory effort.  No retractions. Lungs CTAB. Gastrointestinal: Soft and nontender. No distention.  Musculoskeletal: No lower extremity tenderness nor edema.  No joint effusions. Neurologic:  Normal speech and language. No gross focal neurologic deficits are appreciated. Skin:  Skin is warm, dry and intact. No rash noted.   ____________________________________________   LABS (all labs ordered are listed, but only abnormal results are displayed)  Labs Reviewed  COMPREHENSIVE METABOLIC PANEL - Abnormal; Notable for the following:       Result Value   CO2 21 (*)    Glucose, Bld 201 (*)    BUN 46 (*)    Albumin 3.2 (*)    Total Bilirubin 1.4 (*)    All other components within normal limits  CBC WITH  DIFFERENTIAL/PLATELET   ____________________________________________  EKG  ED ECG REPORT I, Doran Stabler, the attending physician, personally viewed and interpreted this ECG.   Date: 10/21/2016  EKG Time: 1054  Rate: 142  Rhythm: sinus tachycardia  Axis: Normal  Intervals:none  ST&T Change: Mild and diffuse ST depression which is likely rate related.  ED ECG REPORT I, Doran Stabler, the attending physician, personally viewed and interpreted this ECG.   Date: 10/31/2016  EKG Time: 1059  Rate: 145  Rhythm: sinus tachycardia  Axis: Normal  Intervals:nonspecific intraventricular conduction delay  ST&T Change: Persistent depressions  Second EKG with possible V. tach. According to the patient and family's wishes which are on the living Will as well as which I discussed with the family they would not like the tachycardia to be treated at this time. They're requesting only IV fluids as  well as treatment for the patient's thrush.   ____________________________________________  RADIOLOGY   ____________________________________________   PROCEDURES  Procedure(s) performed:   Procedures  Critical Care performed:   ____________________________________________   INITIAL IMPRESSION / ASSESSMENT AND PLAN / ED COURSE  Pertinent labs & imaging results that were available during my care of the patient were reviewed by me and considered in my medical decision making (see chart for details).  ----------------------------------------- 12:37 PM on 10/10/2016 -----------------------------------------  Patient has been evaluated by palliative care who recommends morphine as well as hospice consultation. I reviewed the patient's living will and is very explicit about what care the patient would like including a very limited amount of intervention. As for the patient's and the family's wishes the patient will be admitted to the hospital for fluids as well as treatment with  fluconazole for the thrush. However, we will hold further intervention at this time. Signed out to Dr. Darvin Neighbours. Patient family are understanding of the plan and willing to comply.      ____________________________________________   FINAL CLINICAL IMPRESSION(S) / ED DIAGNOSES  Failure to thrive. Dehydration. Oral thrush. Dysphasia    NEW MEDICATIONS STARTED DURING THIS VISIT:  New Prescriptions   No medications on file     Note:  This document was prepared using Dragon voice recognition software and may include unintentional dictation errors.     Orbie Pyo, MD 10/04/2016 1239

## 2016-10-07 NOTE — Progress Notes (Signed)
New referral for [facility] services at home following discharge received from [facility] following a Palliative Medicine consult. Writer spoke in the room with patient's significant other Eileen Pratt. He is really hoping to get Eileen Pratt's symptoms and pain controlled before leaving the hospital. She is currently requiring IV morphine for control of her throat pain, and she has received one dose of IV Flagyl, he is hoping that this can continue for a fdew more days. He expressed his understanding that the treatment of the thrush will not at this point enable her to continue treatment, he just wants  her to be more comfortable before returning home. He does want hospice services and wishes to discuss DME needs tomorrow. Patient information faxed to referral. Writer's contact information given to [facility]. Hospital care team updated. Will continue to follow through final disposition. Thank you. Flo Shanks RN, BSN, [place] [facility] [place], [facility] [phone]

## 2016-10-07 NOTE — ED Notes (Signed)
Lab called critical WBC of 0.2 and platelet count of 20 - reported to Dr Clearnce Hasten - no new orders at this time

## 2016-10-07 NOTE — Telephone Encounter (Signed)
Spoke with Blue's significant other Eileen Pratt and he stated that Eileen Pratt has been unable to swallow or talk for 48 hours. She has not had a fever, cough or congestion. She is also very weak and unable to stand on her feet for more then a few minutes at a time. He is very worried about her. Given she had her first chemo last week of carbo/etop she may be neutropenic. Advised to go to the emergency department to be evaluated. Discussed with Dr. Grayland Ormond and he agrees.

## 2016-10-07 NOTE — ED Notes (Signed)
Per admitting MD tele discontinued at this time

## 2016-10-07 NOTE — Consult Note (Signed)
Hematology/Oncology Consult note [facility] Telephone:([phone] [phone]  Patient Care Team: System, Provider Not In as PCP - General Lorenza Evangelist, MD (Endocrinology) Bary Castilla Forest Gleason, MD (General Surgery)   Name of the patient: Eileen Pratt  [id number]  1955/11/14    Reason for consult: extensive stage small cell lung cancer   Requesting physician: Dr. Darvin Neighbours  Date of visit: 10/20/2016    History of presenting illness- patient is a 61 year old female who sees Dr. Mike Gip as an outpatient. 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 Chest, abdomen, and pelvic CT scans on 08/25/2016 revealed extensive subcutaneous nodules, a small nodule in the rightupper lobe and larger nodule in the righthilum, nodal metastasis in the mediastinum and along the pericardium. There were bilateral adrenal metastasis, extensive peritoneal nodular metastasis and retroperitoneal nodule metastasis, subcapsular lesions along the kidney and liver likely represents serosal metastasis. There were multiple cystic lesions within the pancreas body and tail. She completed whole brain radiation and started cycle 1 of carboplatin and etoposide on 09/29/2016. She did receive Neulasta on 10/01/2016.  She is currently admitted to the hospital with difficulty in severe pain during swallowing. She has had oral thrush before which was treated with fluconazole. On presentation to the ER she was found to be dehydrated hypotensive and tachycardic. She was able to ambulate and receive treatment until Thursday. On Friday she needed assistance and wheelchair support. She has lost 50 pounds of weight, has no appetite, she has considerable pain and difficulty swalllowing  ECOG PS- 3-4  Pain scale- 6   Review of systems- Review of Systems  Constitutional:  Positive for malaise/fatigue and weight loss. Negative for chills and fever.  HENT: Negative for congestion, ear discharge and nosebleeds.        Pain and difficulty swallowing  Eyes: Negative for blurred vision.  Respiratory: Negative for cough, hemoptysis, sputum production, shortness of breath and wheezing.   Cardiovascular: Negative for chest pain, palpitations, orthopnea and claudication.  Gastrointestinal: Negative for abdominal pain, blood in stool, constipation, diarrhea, heartburn, melena, nausea and vomiting.  Genitourinary: Negative for dysuria, flank pain, frequency, hematuria and urgency.  Musculoskeletal: Negative for back pain, joint pain and myalgias.  Skin: Negative for rash.  Neurological: Positive for weakness. Negative for dizziness, tingling, focal weakness, seizures and headaches.  Endo/Heme/Allergies: Does not bruise/bleed easily.  Psychiatric/Behavioral: Negative for depression and suicidal ideas. The patient does not have insomnia.     Allergies  Allergen Reactions  . Percocet [Oxycodone-Acetaminophen] Other (See Comments)    CHEST PAIN-TOLERATES HYDROCODONE FINE    Patient Active Problem List   Diagnosis Date Noted  . Esophageal candidiasis ([place]) 10/19/2016  . Comfort measures only status 10/05/2016  . Do not resuscitate 10/16/2016  . Non-small cell carcinoma of right lung, stage 4 ([place])   . Failure to thrive (0-17)   . Thrush   . Goals of care, counseling/discussion   . Palliative care by specialist   . Peritoneal metastases ([place])   . Bone metastasis ([place]) 09/24/2016  . Brain metastasis ([place]) 08/30/2016  . 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 Loss of weight 08/17/2016  . Skin cyst 08/17/2016  . Small cell lung cancer, right ([place]) 08/17/2016     Past Medical History:  Diagnosis Date  . GERD (gastroesophageal reflux disease)   .  History of  kidney stones    X2  . Neuroendocrine carcinoma ([place]) 08/17/2016     Past Surgical History:  Procedure Laterality Date  . breast aspiriations Left 2007  . chest wall mass  08/17/2016   NEUROENDOCRINE CARCINOMA, FAVOR METASTASIS  . COLONOSCOPY  2017  . KIDNEY STONE SURGERY    . PORTACATH PLACEMENT Left 09/06/2016   Procedure: INSERTION PORT-A-CATH;  Surgeon: Robert Bellow, MD;  Location: [facility] ORS;  Service: General;  Laterality: Left;    Social History   Social History  . Marital status: Single    Spouse name: N/A  . Number of children: N/A  . Years of education: N/A   Occupational History  . Not on file.   Social History Main Topics  . Smoking status: Former Smoker    Packs/day: 0.50    Years: 20.00    Types: Cigarettes    Quit date: 09/03/2009  . Smokeless tobacco: Never Used  . Alcohol use No  . Drug use: No  . Sexual activity: Not on file   Other Topics Concern  . Not on file   Social History Narrative  . No narrative on file     History reviewed. No pertinent family history.   Current Facility-Administered Medications:  .  0.9 % NaCl with KCl 20 mEq/ L  infusion, , Intravenous, Continuous, Sudini, Srikar, MD, Last Rate: 100 mL/hr at 10/04/2016 1539 .  acetaminophen (TYLENOL) tablet 650 mg, 650 mg, Oral, Q6H PRN **OR** acetaminophen (TYLENOL) suppository 650 mg, 650 mg, Rectal, Q6H PRN, Sudini, Srikar, MD .  albuterol (PROVENTIL) (2.5 MG/3ML) 0.083% nebulizer solution 2.5 mg, 2.5 mg, Nebulization, Q2H PRN, Sudini, Srikar, MD .  bisacodyl (DULCOLAX) suppository 10 mg, 10 mg, Rectal, Daily PRN, Sudini, Srikar, MD .  fluconazole (DIFLUCAN) IVPB 200 mg, 200 mg, Intravenous, Q24H, Schaevitz, Randall An, MD, Stopped at 10/04/2016 1256 .  magic mouthwash, 10 mL, Oral, QID, 10 mL at 10/20/2016 1517 **AND** lidocaine (XYLOCAINE) 2 % viscous mouth solution 10 mL, 10 mL, Mouth/Throat, QID, Sudini, Srikar, MD, 10 mL at 10/23/2016 1517 .  morphine 2 MG/ML injection 2 mg, 2  mg, Intravenous, Q1H PRN, Earlie Counts, NP, 2 mg at 10/09/2016 1539 .  ondansetron (ZOFRAN) 8 mg in sodium chloride 0.9 % 50 mL IVPB, 8 mg, Intravenous, Q6H PRN, Mahan, Kasie J, NP .  polyethylene glycol (MIRALAX / GLYCOLAX) packet 17 g, 17 g, Oral, Daily PRN, Sudini, Srikar, MD .  sodium phosphate (FLEET) 7-19 GM/118ML enema 1 enema, 1 enema, Rectal, Once PRN, Hillary Bow, MD   Physical exam:  Vitals:   10/27/2016 1230 10/05/2016 1300 10/31/2016 1350 10/10/2016 1408  BP: 128/72 128/75  (!) 97/43  Pulse:  (!) 110  (!) 113  Resp: 20   18  Temp:    (!) 97.5 F (36.4 C)  TempSrc:    Oral  SpO2:  97%  94%  Weight:   90 lb (40.8 kg)   Height:   5\' 2"  (1.575 m)    Physical Exam  Constitutional:  Fatigued and cachectic  HENT:  Head: Normocephalic and atraumatic.  Severe thrush noted  Eyes: EOM are normal. Pupils are equal, round, and reactive to light.  Neck: Normal range of motion.  Cardiovascular: Normal rate, regular rhythm and normal heart sounds.   Pulmonary/Chest: Effort normal and breath sounds normal.  Abdominal: Soft. Bowel sounds are normal.  Neurological: She is alert.  But drowsy  Skin: Skin is warm and dry.  CMP Latest Ref Rng & Units 10/18/2016  Glucose 65 - 99 mg/dL 201(H)  BUN 6 - 20 mg/dL 46(H)  Creatinine 0.44 - 1.00 mg/dL 0.78  Sodium 135 - 145 mmol/L 136  Potassium 3.5 - 5.1 mmol/L 4.0  Chloride 101 - 111 mmol/L 101  CO2 22 - 32 mmol/L 21(L)  Calcium 8.9 - 10.3 mg/dL 9.2  Total Protein 6.5 - 8.1 g/dL 6.8  Total Bilirubin 0.3 - 1.2 mg/dL 1.4(H)  Alkaline Phos 38 - 126 U/L 93  AST 15 - 41 U/L 21  ALT 14 - 54 U/L 19   CBC Latest Ref Rng & Units 10/28/2016  WBC 3.6 - 11.0 K/uL 0.2(LL)  Hemoglobin 12.0 - 16.0 g/dL 14.8  Hematocrit 35.0 - 47.0 % 43.2  Platelets 150 - 440 K/uL 20(LL)    @IMAGES @  Dg Forearm Left  Result Date: 09/29/2016 CLINICAL DATA:  Metastatic neuroendocrine tumor with multifocal areas of abnormal bone uptake on prior bone scan.  EXAM: LEFT FOREARM - 2 VIEW COMPARISON:  09/24/2016 FINDINGS: There is a ill-defined mottled appearance of the mid left radius corresponding to the area of abnormal bony uptake on prior bone scan. Findings concerning for metastatic focus. No fracture. IMPRESSION: Poorly defined mottled area in the proximal to mid left humeral shaft concerning for metastasis. Electronically Signed   By: Rolm Baptise M.D.   On: 09/29/2016 15:08   Nm Bone Scan Whole Body  Result Date: 09/24/2016 CLINICAL DATA:  Metastatic neuroendocrine tumor. EXAM: NUCLEAR MEDICINE WHOLE BODY BONE SCAN TECHNIQUE: Whole body anterior and posterior images were obtained approximately 3 hours after intravenous injection of radiopharmaceutical. RADIOPHARMACEUTICALS:  23.08 mCi Technetium-11m MDP IV COMPARISON:  Brain MRI 5/25/ 18 and CT chest abdomen and pelvis from 08/24/2016 FINDINGS: There is diffuse heterogeneous radiotracer activity throughout the calvarium corresponding to calvarial metastasis identified on recent brain MRI. Focal area of asymmetric increased radiotracer uptake corresponding to the proximal left forearm is identified. Medium size focus of mild increased radiotracer activity localizing to the mid shaft of the left femur is identified. Small focus of moderate increased uptake localizing to the medial aspect of the left humeral head is noted. There is heterogeneous cortical based uptake along the lateral aspect of bilateral proximal tibiae. Physiologic tracer activity is seen within both kidneys an urinary bladder. IMPRESSION: Multifocal areas of abnormal increased uptake within the calvarium, left upper extremity and left femur are identified compatible with bone metastases. Suggest plain film correlation with imaging of the left forearm, and left femur. Increased uptake within bilateral proximal tibia noted which may represent areas of metastatic disease. Hypertrophic pulmonary osteoarthritis of the is also a differential  consideration with lower extremity increase cortical uptake, particularly in this patient who has evidence of pulmonary malignancy and emphysema. Suggest radiographic correlation with plain film radiographs of both tibiae. Electronically Signed   By: Kerby Moors M.D.   On: 09/24/2016 15:16   Dg Femur Min 2 Views Left  Result Date: 09/29/2016 CLINICAL DATA:  History of malignancies. EXAM: LEFT FEMUR 2 VIEWS COMPARISON:  Bone scan 09/24/2016. FINDINGS: Very subtle lucency in the central portion of the left femur in the region of bone scan abnormality cannot be excluded. No prominent abnormality identified. There is no evidence of pathologic fracture. IMPRESSION: Very subtle lucency in the region of previously identified bone scan abnormality within the mid left femur cannot be excluded. No prominent focal abnormality identified . No evidence of pathologic fracture. Electronically Signed   By: Marcello Moores  Register  On: 09/29/2016 15:10    Assessment and plan- Patient is a 61 y.o. female with newly diagnosed extensive stage small cell lung cancer with subcutaneous, liver, kidney, adrenal, peritoneal, lymph nodes and brain metastases status post 1 cycle of carboplatin and etoposide on 09/29/16  1. Extensive stage small cell lung cancer- her PS is very poor and although small cell lung cancer is very responsive to chemotherapy, I doubt she can tolerate anymore treatment given her performance status. Hospice would be appropriate for her but I would like Dr. Mike Gip to weigh in as well on Monday. Patient is DNR and her prognosis is likely in weeks  2. pancytopenia due to chemotherapy- she has already received neulasta and teherfore does not require neupogen at this point. Hopefully it will respond in the next few days. Severe thrombocytopenia secondary to chemotherapy. Transfuse if platelet count <10 or if she has bleeding  3. Oral thrush- agree with IV fluconazole and pain management. I recommend inpatient  stay for few days and better control of her pain and dysphagia from thrush with IV fluconazole and IV pain meds before she goes home with hospice. Home hospice cannot provide IV pain meds and patient presently cannot swallow PO meds.    We will continue to follow the patient with you   Visit Diagnosis 1. Dysphagia, unspecified type   2. Thrush   3. Failure to thrive (0-17)   4. Dehydration     Dr. Randa Evens, MD, MPH [facility] at [facility] Pager- [phone] 10/22/2016

## 2016-10-07 NOTE — Consult Note (Signed)
Consultation Note Date: 10/12/2016   Patient Name: Eileen Pratt  DOB: [birth year]  MRN: [id number]  Age / Sex: 61 y.o., female  PCP: System, Provider Not In Referring Physician: Orbie Pyo*  Reason for Consultation: Establishing goals of care  HPI/Patient Profile: 61 y.o. female  with past medical history of SCLC metastatic to brain, skull, dura, subcutaneous mets, adrenal and peritoneal mets. S/P palliative whole brain radiation (completed 6/20) and currently undergoing chemotherapy first cycle completed carboplatin and etoposide on 6/29. Evaluated in the emergency department on 10/22/2016. She presented with difficulty swallowing and eating for two days. Workup reveals severe thrush, dehydration. She is cachetic, tachycardic. Palliative medicine consulted for [place].  Clinical Assessment and Goals of Care: Met with patient, her brother Kyung Rudd (who is HCPOA) and her "special friend" at bedside.   I introduced Palliative Medicine as specialized medical care for people living with serious illness. It focuses on providing relief from the symptoms and stress of a serious illness. The goal is to improve quality of life for both the patient and the family.  We discussed a brief life review of the patient.  Then I assessed functional and nutritional status at home by gathering history. We discussed their current illness and what it means in the larger context of their on-going co-morbidities.  Natural disease trajectory and expectations at EOL were discussed.   Patient was eating small amounts 2 days ago before developing thrush. She is now very weak and unable to walk. She is experiencing pain in her abdomen, mouth and throat. Abdominal pain is chronic. Mouth and throat pain is new onset with white coating and red tongue. She is unable to eat, but is able to chew and swallow ice chips without aspiration.  Patient and family state their main [place] are to optimize patient medically as much as possible with IV fluids, symptom management, treatment for thrush, and return home with Hospice support.   Patient has advanced directives in place, that state she would not want life prolonging measures including artificial feeding, hydration, antibiotics if it were determined she had irreversible disease process and was unable to communicate for herself.   We discussed code status. Patient and family request DNR status. They understand that this means if patient were to experience cardiac arrest and to stop breathing, she would not receive CPR and would not be intubated. This aligns with patient's GOC.   Discussed that if patient continues not to be able to eat despite treatment for thrush, then residential hospice may be an option.  Reviewed services offered by Hospice.   Primary Decision Maker PATIENT with support of HCPOA- her brother, Kyung Rudd    SUMMARY OF RECOMMENDATIONS -DNR -Morphine 54m IV q1hr prn pain -Ondansetron 851mIV q6hr prn nausea -Magic mouthwash with lidocaine swish and swallow if able QID -Optimize medically then discharge home with Hospice    Code Status/Advance Care Planning:  DNR    Symptom Management:   As above  Palliative Prophylaxis:   Frequent Pain Assessment  Additional Recommendations (  Limitations, Scope, Preferences):  No Artificial Feeding and No Chemotherapy  Prognosis:    < 3 months due to aggressive cancer, plan to stop chemotherapy and transition to Hospice service  Discharge Planning: Home with Hospice  Primary Diagnoses: Present on Admission: **None**   I have reviewed the medical record, interviewed the patient and family, and examined the patient. The following aspects are pertinent.  Past Medical History:  Diagnosis Date  . GERD (gastroesophageal reflux disease)   . History of kidney stones    X2  . Neuroendocrine carcinoma ([place])  08/17/2016   Social History   Social History  . Marital status: Single    Spouse name: N/A  . Number of children: N/A  . Years of education: N/A   Social History Main Topics  . Smoking status: Former Smoker    Packs/day: 0.50    Years: 20.00    Types: Cigarettes    Quit date: 09/03/2009  . Smokeless tobacco: Never Used  . Alcohol use No  . Drug use: No  . Sexual activity: Not Asked   Other Topics Concern  . None   Social History Narrative  . None   History reviewed. No pertinent family history. Scheduled Meds: . magic mouthwash w/lidocaine  10 mL Oral QID  .  morphine injection  2 mg Intravenous NOW   Continuous Infusions: . fluconazole (DIFLUCAN) IV 200 mg (10/26/2016 1146)  . ondansetron (ZOFRAN) IV     PRN Meds:.morphine injection, ondansetron (ZOFRAN) IV Medications Prior to Admission:  Prior to Admission medications   Medication Sig Start Date End Date Taking? Authorizing Provider  dexamethasone (DECADRON) 4 MG tablet Take 1 tablet (4 mg total) by mouth 3 (three) times daily. 09/24/16   Consuello Bossier, MD  fluconazole (DIFLUCAN) 100 MG tablet Take 1 tablet (100 mg total) by mouth daily. 09/14/16   Noreene Filbert, MD  HYDROcodone-acetaminophen (NORCO) 5-325 MG tablet Take 1 tablet by mouth every 6 (six) hours as needed for moderate pain. 09/29/16   Jacquelin Hawking, NP  lidocaine-prilocaine (EMLA) cream Apply to affected area once Patient not taking: Reported on 09/29/2016 09/29/16   Lloyd Huger, MD  megestrol (MEGACE) 20 MG tablet Take 1 tablet (20 mg total) by mouth daily. 09/29/16   Jacquelin Hawking, NP  nystatin (MYCOSTATIN) 100000 UNIT/ML suspension Take 5 mLs (500,000 Units total) by mouth 4 (four) times daily. Swish and spit 09/03/16   Lequita Asal, MD  omeprazole (PRILOSEC) 20 MG capsule Take 1 capsule (20 mg total) by mouth daily. Patient taking differently: Take 20 mg by mouth daily before breakfast.  08/27/16   Lequita Asal, MD    ondansetron (ZOFRAN) 8 MG tablet Take 1 tablet (8 mg total) by mouth 2 (two) times daily as needed for refractory nausea / vomiting. Start on day 3 after carboplatin chemo. Patient not taking: Reported on 09/29/2016 09/29/16   Lloyd Huger, MD  prochlorperazine (COMPAZINE) 10 MG tablet Take 1 tablet (10 mg total) by mouth every 6 (six) hours as needed (Nausea or vomiting). Patient not taking: Reported on 09/29/2016 09/29/16   Lloyd Huger, MD   Allergies  Allergen Reactions  . Percocet [Oxycodone-Acetaminophen] Other (See Comments)    CHEST PAIN-TOLERATES HYDROCODONE FINE   Review of Systems  Constitutional: Positive for activity change, appetite change, fatigue and unexpected weight change.  HENT: Positive for mouth sores, sore throat and trouble swallowing.   Gastrointestinal: Positive for abdominal pain.  Musculoskeletal: Positive for back pain.  Psychiatric/Behavioral: Negative for self-injury. The patient is not nervous/anxious.   All other systems reviewed and are negative.   Physical Exam  Constitutional: She appears cachectic. She has a sickly appearance.  HENT:  Mouth and throat with thick, white coating over beefy red tongue. Lesion above lip, unable to speak  Cardiovascular: Tachycardia present.   Abdominal: Soft.  Musculoskeletal:  Muscle wasting present  Neurological:  Nods yes and no appropriately to questions  Psychiatric:  Flat affect    Vital Signs: BP (!) 82/58 (BP Location: Left Arm)   Pulse (!) 132   Temp 98.8 F (37.1 C) (Oral)   Resp 15   Wt 40.4 kg (89 lb)   SpO2 99%   BMI 13.94 kg/m          SpO2: SpO2: 99 % O2 Device:SpO2: 99 % O2 Flow Rate: .   IO: Intake/output summary: No intake or output data in the 24 hours ending 10/04/2016 1224  LBM:   Baseline Weight: Weight: 40.4 kg (89 lb) Most recent weight: Weight: 40.4 kg (89 lb)     Palliative Assessment/Data: PPS: 10%     Thank you for this consult. Palliative medicine will  continue to follow and assist as needed.   Time Total: 80 minutes  Greater than 50%  of this time was spent counseling and coordinating care related to the above assessment and plan.  Signed by: Mariana Kaufman, AGNP-C Palliative Medicine    Please contact Palliative Medicine Team phone at [phone] for questions and concerns.  For individual provider: See Shea Evans

## 2016-10-07 NOTE — Progress Notes (Signed)
While rounding, Reynolds made initial visit to room 106. RN suggested they could use a [employer] visit. Pt was resting. Boyfriend was bedside. BF stated that they had been together for over 40 years. Stated that they have not been regular church attenders, but have a relationship with God. This relationship has increased with the illness. [place] engaged the BF in relational conversation. BF did not indicated a need for spiritual care at this time. BF did state that he would like additional visits. [place] will follow up on 2016-10-19.    10/22/2016 1500  Clinical Encounter Type  Visited With Patient;Patient and family together;Health care provider  Visit Type Initial;Spiritual support  Referral From Nurse  Consult/Referral To Chaplain  Spiritual Encounters  Spiritual Needs Emotional

## 2016-10-07 NOTE — ED Triage Notes (Signed)
FIRST NURSE NOTE-sent from [facility] doc office. Stage 4 lung CA. Just finished RT and started chemo.  Pt unable to swallow per family report.

## 2016-10-07 NOTE — Care Management (Signed)
Admitted to this facility with the diagnosis of neuroendocrine disease. Lives with significant other x 42 years. Hoy Morn [phone]). Sees Dr, Mike Gip at the [facility] to [facility]. No home health. No skilled facility. No home oxygen. Wheelchair, rolling walker, cane and bedside commode Self feed, needs help with dressing and baths, No falls. Decreased appetite. Discussed Hospice agencies in the home. Chose Hospice or [facility]. Flo Shanks, RN representative for [facility] updated Shelbie Ammons RN MSN CCM Care Management [phone]

## 2016-10-07 NOTE — ED Notes (Signed)
No orders placed, pt in room, dr Clearnce Hasten at bedside

## 2016-10-07 NOTE — H&P (Signed)
[facility] NAME: Eileen Pratt    MR#:  [id number]  DATE OF BIRTH:  [birth year]  DATE OF ADMISSION:  10/11/2016  PRIMARY CARE PHYSICIAN: System, Provider Not In   REQUESTING/REFERRING PHYSICIAN: Dr. Lucita Lora  CHIEF COMPLAINT:   Chief Complaint  Patient presents with  . Dysphagia    HISTORY OF PRESENT ILLNESS:  Eileen Pratt  is a 61 y.o. 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 Patient had similar symptoms after her first round of treatment and was given 5 days of oral fluconazole and improved. She has noticed thrush again and has severe pain and unable to swallow. Patient has been found to be hypotensive, tachycardic, dehydrated and is being admitted to the hospital.  She has lost close to 50 pounds.  She is accompanied by her brother and friend at bedside.  PAST MEDICAL HISTORY:   Past Medical History:  Diagnosis Date  . GERD (gastroesophageal reflux disease)   . History of kidney stones    X2  . Neuroendocrine carcinoma ([employer]) 08/17/2016    PAST SURGICAL HISTORY:   Past Surgical History:  Procedure Laterality Date  . breast aspiriations Left 2007  . chest wall mass  08/17/2016   NEUROENDOCRINE CARCINOMA, FAVOR METASTASIS  . COLONOSCOPY  2017  . KIDNEY STONE SURGERY    . PORTACATH PLACEMENT Left 09/06/2016   Procedure: INSERTION PORT-A-CATH;  Surgeon: Robert Bellow, MD;  Location: [facility] ORS;  Service: General;  Laterality: Left;    SOCIAL HISTORY:   Social History  Substance Use Topics  . Smoking status: Former Smoker    Packs/day: 0.50    Years: 20.00    Types: Cigarettes    Quit date: 09/03/2009  . Smokeless tobacco: Never Used  . Alcohol use No    FAMILY HISTORY:  History reviewed. No pertinent family history.  Mother deceased  DRUG ALLERGIES:    Allergies  Allergen Reactions  . Percocet [Oxycodone-Acetaminophen] Other (See Comments)    CHEST PAIN-TOLERATES HYDROCODONE FINE    REVIEW OF SYSTEMS:   Review of Systems  Constitutional: Positive for malaise/fatigue and weight loss. Negative for chills and fever.  HENT: Negative for hearing loss and nosebleeds.   Eyes: Negative for blurred vision, double vision and pain.  Respiratory: Positive for cough. Negative for hemoptysis, sputum production, shortness of breath and wheezing.   Cardiovascular: Negative for chest pain, palpitations, orthopnea and leg swelling.  Gastrointestinal: Positive for abdominal pain. Negative for constipation, diarrhea, nausea and vomiting.  Genitourinary: Negative for dysuria and hematuria.  Musculoskeletal: Negative for back pain, falls and myalgias.  Skin: Negative for rash.  Neurological: Positive for dizziness and weakness. Negative for tremors, sensory change, speech change, focal weakness, seizures and headaches.  Endo/Heme/Allergies: Does not bruise/bleed easily.  Psychiatric/Behavioral: Negative for depression and memory loss. The patient is not nervous/anxious.     MEDICATIONS AT HOME:   Prior to Admission medications   Medication Sig Start Date End Date Taking? Authorizing Provider  dexamethasone (DECADRON) 4 MG tablet Take 1 tablet (4 mg total) by mouth 3 (three) times daily. 09/24/16   Consuello Bossier, MD  fluconazole (DIFLUCAN) 100 MG tablet Take 1 tablet (100 mg total) by mouth daily. 09/14/16   Noreene Filbert, MD  HYDROcodone-acetaminophen (NORCO) 5-325 MG tablet Take 1 tablet by mouth every 6 (six) hours  as needed for moderate pain. 09/29/16   Jacquelin Hawking, NP  lidocaine-prilocaine (EMLA) cream Apply to affected area once Patient not taking: Reported on 09/29/2016 09/29/16   Lloyd Huger, MD  megestrol (MEGACE) 20 MG tablet Take 1 tablet (20 mg total) by mouth daily. 09/29/16   Jacquelin Hawking, NP  nystatin (MYCOSTATIN)  100000 UNIT/ML suspension Take 5 mLs (500,000 Units total) by mouth 4 (four) times daily. Swish and spit 09/03/16   Lequita Asal, MD  omeprazole (PRILOSEC) 20 MG capsule Take 1 capsule (20 mg total) by mouth daily. Patient taking differently: Take 20 mg by mouth daily before breakfast.  08/27/16   Lequita Asal, MD  ondansetron (ZOFRAN) 8 MG tablet Take 1 tablet (8 mg total) by mouth 2 (two) times daily as needed for refractory nausea / vomiting. Start on day 3 after carboplatin chemo. Patient not taking: Reported on 09/29/2016 09/29/16   Lloyd Huger, MD  prochlorperazine (COMPAZINE) 10 MG tablet Take 1 tablet (10 mg total) by mouth every 6 (six) hours as needed (Nausea or vomiting). Patient not taking: Reported on 09/29/2016 09/29/16   Lloyd Huger, MD     VITAL SIGNS:  Blood pressure (!) 82/58, pulse (!) 132, temperature 98.8 F (37.1 C), temperature source Oral, resp. rate 15, weight 40.4 kg (89 lb), SpO2 99 %.  PHYSICAL EXAMINATION:  Physical Exam  GENERAL:  61 y.o.-year-old patient lying in the bed, Cachectic. EYES: Pupils equal, round, reactive to light and accommodation. No scleral icterus. Extraocular muscles intact.  HEENT: Head atraumatic, normocephalic.  Dry oral mucosa with thrush on the tongue and oropharyngeal NECK:  Supple, no jugular venous distention. No thyroid enlargement, no tenderness.  LUNGS: Bilateral basal coarse breath sounds CARDIOVASCULAR: S1, S2 normal. No murmurs, rubs, or gallops.  ABDOMEN: Soft, nontender, nondistended. Bowel sounds present. No organomegaly or mass.  EXTREMITIES: No pedal edema, cyanosis, or clubbing. + 2 pedal & radial pulses b/l.   NEUROLOGIC: Cranial nerves II through XII are intact. No focal Motor or sensory deficits appreciated b/l PSYCHIATRIC: The patient is alert and awake. Anxious. SKIN: Scattered Lesions from her neuroendocrine tumor  LABORATORY PANEL:   CBC No results for input(s): WBC, HGB, HCT, PLT in the  last 168 hours. ------------------------------------------------------------------------------------------------------------------  Chemistries   Recent Labs Lab 10/21/2016 1109  NA 136  K 4.0  CL 101  CO2 21*  GLUCOSE 201*  BUN 46*  CREATININE 0.78  CALCIUM 9.2  AST 21  ALT 19  ALKPHOS 93  BILITOT 1.4*   ------------------------------------------------------------------------------------------------------------------  Cardiac Enzymes No results for input(s): TROPONINI in the last 168 hours. ------------------------------------------------------------------------------------------------------------------  RADIOLOGY:  No results found.   IMPRESSION AND PLAN:   * Oral and esophageal candida Causing severe dysphagia/odynophagia Start IV fluconazole. Patient will be nothing by mouth except ice chips. Immunocompromised. Start diet once patient swallowing improves. Patient does not want any aggressive measures or IV antibiotics. She is okay having fluconazole as this will help her with her swallowing.  * Stage IV neuroendocrine tumor. Patient has decided not to continue any further treatment.  * Hypovolemic hypotension. We'll bolus normal saline stat. He is critically ill. She has chosen to be a DO NOT RESUSCITATE. Palliative care has seen the patient. Patient does not want a PEG tube.  * Pancytopenia due to chemotherapy. We'll monitor. Severe thrombocytopenia but no bleeding. Neutropenic precautions. Consult oncology.  * Sepsis. Has leukopenia and tachycardia with hypotension. The patient does not want any IV antibiotics. We  will manage conservatively with fluids. No cultures needed.  * DVT prophylaxis. No heparin or Lovenox due to thrombocytopenia  All the records are reviewed and case discussed with ED provider. Management plans discussed with the patient, family and they are in agreement.  CODE STATUS: DNR  TOTAL TIME TAKING CARE OF THIS PATIENT: 40 minutes.    Hillary Bow R M.D on 10/18/2016 at 12:48 PM  Between 7am to 6pm - Pager - [phone]  After 6pm go to [email or web address] - password [email or web address] [facility]  Office  [phone]  CC: Primary care physician; System, Provider Not In  Note: This dictation was prepared with Dragon dictation along with smaller phrase technology. Any transcriptional errors that result from this process are unintentional.

## 2016-10-07 NOTE — ED Triage Notes (Signed)
Pt here for dysphagia. Spitting secretions when helped get out of car. Cannot talk well.  Just finished RT and started chemotherapy. Appears very weak.

## 2016-10-08 DIAGNOSIS — R5081 Fever presenting with conditions classified elsewhere: Secondary | ICD-10-CM

## 2016-10-08 DIAGNOSIS — D709 Neutropenia, unspecified: Secondary | ICD-10-CM | POA: Diagnosis present

## 2016-10-08 DIAGNOSIS — E43 Unspecified severe protein-calorie malnutrition: Secondary | ICD-10-CM | POA: Diagnosis present

## 2016-10-08 DIAGNOSIS — D696 Thrombocytopenia, unspecified: Secondary | ICD-10-CM | POA: Diagnosis present

## 2016-10-08 DIAGNOSIS — E860 Dehydration: Secondary | ICD-10-CM | POA: Diagnosis present

## 2016-10-09 ENCOUNTER — Other Ambulatory Visit: Payer: Self-pay | Admitting: Nurse Practitioner

## 2016-10-11 ENCOUNTER — Inpatient Hospital Stay: Payer: BC Managed Care – PPO

## 2016-10-12 ENCOUNTER — Inpatient Hospital Stay: Payer: BC Managed Care – PPO | Admitting: Hematology and Oncology

## 2016-10-12 ENCOUNTER — Other Ambulatory Visit: Payer: BC Managed Care – PPO

## 2016-10-13 ENCOUNTER — Ambulatory Visit: Payer: BC Managed Care – PPO | Admitting: Oncology

## 2016-10-13 ENCOUNTER — Other Ambulatory Visit: Payer: BC Managed Care – PPO

## 2016-10-19 ENCOUNTER — Ambulatory Visit: Payer: BC Managed Care – PPO | Admitting: Hematology and Oncology

## 2016-10-19 ENCOUNTER — Ambulatory Visit: Payer: BC Managed Care – PPO

## 2016-10-19 ENCOUNTER — Other Ambulatory Visit: Payer: BC Managed Care – PPO

## 2016-10-20 ENCOUNTER — Ambulatory Visit: Payer: BC Managed Care – PPO | Admitting: Oncology

## 2016-10-20 ENCOUNTER — Ambulatory Visit: Payer: BC Managed Care – PPO

## 2016-10-20 ENCOUNTER — Other Ambulatory Visit: Payer: BC Managed Care – PPO

## 2016-10-21 ENCOUNTER — Ambulatory Visit: Payer: BC Managed Care – PPO

## 2016-10-28 ENCOUNTER — Ambulatory Visit: Payer: BC Managed Care – PPO | Admitting: Radiation Oncology

## 2016-11-03 NOTE — Final Progress Note (Signed)
During 0200 RN rounds, pt noted to be unresponsive, no pulses palpated, no heart or lung sounds auscultated.  Charge RN called into room, confirmed this RN's findings.  Emotional support provided to significant other, Garry.  Dr. Estanislado Pandy paged, arrived to bedside, pronounced TOD 0245.  Nursing supervisor called, arrived to bedside, discussed plans w/ Hoy Morn.  Hoy Morn attempted to call pt's brothers, awaiting their response/arrival.  CDS called, pt potential eye donor, will prep eyes, body after brothers visit.

## 2016-11-03 NOTE — Death Summary Note (Addendum)
DEATH SUMMARY   Patient Details  Name: Eileen Pratt MRN: [id number] DOB: 04/06/1955  Admission/Discharge Information   Admit Date:  10-14-2016  Date of Death: Date of Death: Oct 15, 2016  Time of Death: Time of Death: 0245  Length of Stay: 1  Referring Physician: System, Provider Not In   Reason(s) for Hospitalization  Severe dehydration, dysphagia, esophageal candidiasis  Diagnoses  Preliminary cause of death:  Secondary Diagnoses (including complications and co-morbidities):  Active Problems:   Esophageal candidiasis (HCC)   Comfort measures only status   Do not resuscitate   Neutropenia with fever (HCC)   Thrombocytopenia (HCC)   Severe protein-calorie malnutrition ([facility]: less than 60% of standard weight) [facility])   Dehydration   Brief Hospital Course (including significant findings, care, treatment, and services provided and events leading to death)  Eileen Pratt is a 61 y.o. year old female with history of metastatic neuroendocrine tumor undergoing radiation and chemotherapy with multiple comorbidities and significant decline presented to the hospital for worsening dysphagia and unable to swallow even liquids. Patient was found to have severe oral thrush with high suspicion for esophageal candidiasis and admitted to the hospitalist service. Patient also had new diagnosis of pancytopenia and possible sepsis. After discussing with palliative care she decided to pursue comfort measures but gets started on fluconazole to help her swallowing. She did not want any aggressive procedures or IV antibiotics. Patient was admitted for IV fluconazole. She was a DO NOT RESUSCITATE and Comfort Care status.  Very poor prognosis and had high risk of complications.  After admission patient was found unresponsive without a pulse. Pronounced dead at 2.45 AM on Oct 15, 2016.  Pertinent Labs and Studies  Significant Diagnostic Studies Dg Forearm Left  Result Date: 09/29/2016 CLINICAL DATA:   Metastatic neuroendocrine tumor with multifocal areas of abnormal bone uptake on prior bone scan. EXAM: LEFT FOREARM - 2 VIEW COMPARISON:  09/24/2016 FINDINGS: There is a ill-defined mottled appearance of the mid left radius corresponding to the area of abnormal bony uptake on prior bone scan. Findings concerning for metastatic focus. No fracture. IMPRESSION: Poorly defined mottled area in the proximal to mid left humeral shaft concerning for metastasis. Electronically Signed   By: Rolm Baptise M.D.   On: 09/29/2016 15:08   Nm Bone Scan Whole Body  Result Date: 09/24/2016 CLINICAL DATA:  Metastatic neuroendocrine tumor. EXAM: NUCLEAR MEDICINE WHOLE BODY BONE SCAN TECHNIQUE: Whole body anterior and posterior images were obtained approximately 3 hours after intravenous injection of radiopharmaceutical. RADIOPHARMACEUTICALS:  23.08 mCi Technetium-58m MDP IV COMPARISON:  Brain MRI 5/25/ 18 and CT chest abdomen and pelvis from 08/24/2016 FINDINGS: There is diffuse heterogeneous radiotracer activity throughout the calvarium corresponding to calvarial metastasis identified on recent brain MRI. Focal area of asymmetric increased radiotracer uptake corresponding to the proximal left forearm is identified. Medium size focus of mild increased radiotracer activity localizing to the mid shaft of the left femur is identified. Small focus of moderate increased uptake localizing to the medial aspect of the left humeral head is noted. There is heterogeneous cortical based uptake along the lateral aspect of bilateral proximal tibiae. Physiologic tracer activity is seen within both kidneys an urinary bladder. IMPRESSION: Multifocal areas of abnormal increased uptake within the calvarium, left upper extremity and left femur are identified compatible with bone metastases. Suggest plain film correlation with imaging of the left forearm, and left femur. Increased uptake within bilateral proximal tibia noted which may represent areas  of metastatic disease. Hypertrophic pulmonary osteoarthritis of the  is also a differential consideration with lower extremity increase cortical uptake, particularly in this patient who has evidence of pulmonary malignancy and emphysema. Suggest radiographic correlation with plain film radiographs of both tibiae. Electronically Signed   By: Kerby Moors M.D.   On: 09/24/2016 15:16   Dg Femur Min 2 Views Left  Result Date: 09/29/2016 CLINICAL DATA:  History of malignancies. EXAM: LEFT FEMUR 2 VIEWS COMPARISON:  Bone scan 09/24/2016. FINDINGS: Very subtle lucency in the central portion of the left femur in the region of bone scan abnormality cannot be excluded. No prominent abnormality identified. There is no evidence of pathologic fracture. IMPRESSION: Very subtle lucency in the region of previously identified bone scan abnormality within the mid left femur cannot be excluded. No prominent focal abnormality identified . No evidence of pathologic fracture. Electronically Signed   By: Marcello Moores  Register   On: 09/29/2016 15:10    Microbiology No results found for this or any previous visit (from the past 240 hour(s)).  Lab Basic Metabolic Panel:  Recent Labs Lab 10/03/2016 1109  NA 136  K 4.0  CL 101  CO2 21*  GLUCOSE 201*  BUN 46*  CREATININE 0.78  CALCIUM 9.2   Liver Function Tests:  Recent Labs Lab 10/17/2016 1109  AST 21  ALT 19  ALKPHOS 93  BILITOT 1.4*  PROT 6.8  ALBUMIN 3.2*   No results for input(s): LIPASE, AMYLASE in the last 168 hours. No results for input(s): AMMONIA in the last 168 hours. CBC:  Recent Labs Lab 10/04/2016 1109  WBC 0.2*  NEUTROABS 0.0*  HGB 14.8  HCT 43.2  MCV 86.4  PLT 20*   Cardiac Enzymes: No results for input(s): CKTOTAL, CKMB, CKMBINDEX, TROPONINI in the last 168 hours. Sepsis Labs:  Recent Labs Lab 10/29/2016 1109  WBC 0.2*    Procedures/Operations  None   Tashon Capp R October 31, 2016, 10:58 AM

## 2016-11-03 DEATH — deceased

## 2018-05-30 IMAGING — MR MR HEAD WO/W CM
8 of 13 series · 27 of 48 positions shown · IV contrast (multihance)
Comparison: CT chest abdomen and pelvis 08/24/2016.

CLINICAL DATA: 60-year-old female with widespread metastatic
disease recently discovered on CT chest abdomen and pelvis. Biopsy
of a subcutaneous lesion revealed neuroendocrine tumor.

EXAM:
MRI HEAD WITHOUT AND WITH CONTRAST
TECHNIQUE: Multiplanar, multiecho pulse sequences of the brain and surrounding
structures were obtained without and with intravenous contrast.
CONTRAST:  10mL MULTIHANCE GADOBENATE DIMEGLUMINE 529 MG/ML IV SOLN

[Series 4: DWI · axial · 3.0mm · 0.94mm/px · z∈[-44,+100]mm · 3 of 50 slices shown (1 of 2)]
[im 1/50]
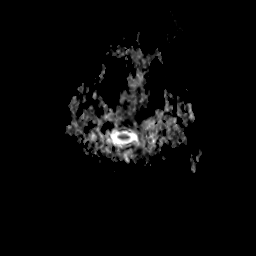
[im 25/50]
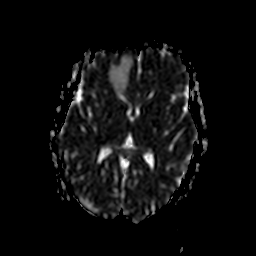
[im 50/50]
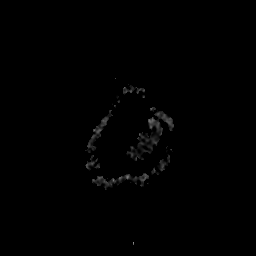

[Series 7: DWI · coronal · 5.0mm · 1.80mm/px · 3 of 39 slices shown (2 of 2)]
[im 1/39]
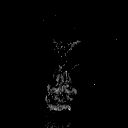
[im 20/39]
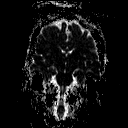
[im 39/39]
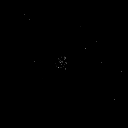

[Series 9: T2 · axial · 5.0mm · 0.45mm/px · z∈[-48,+102]mm · 2 of 23 slices shown (1 of 2)]
[im 1/23]
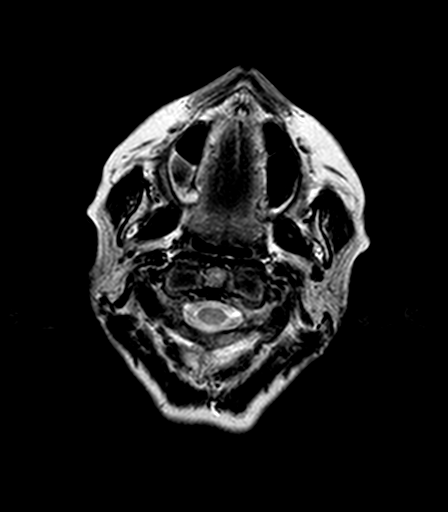
[im 23/23]
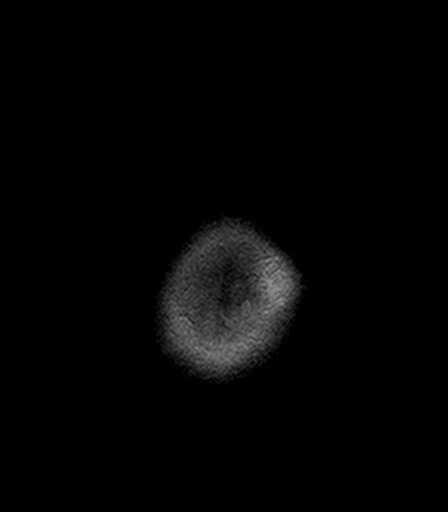

[Series 10: FLAIR · axial · 3.0mm · 0.90mm/px · z∈[-45,+98]mm · 3 of 50 slices shown]
[im 1/50]
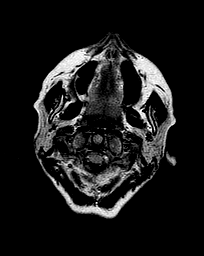
[im 25/50]
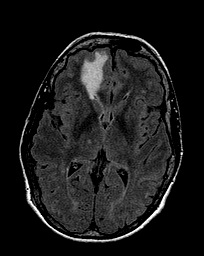
[im 50/50]
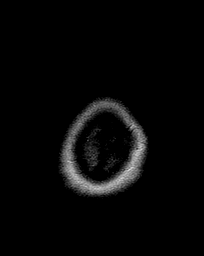

[Series 11: T2 · axial · 5.0mm · 0.45mm/px · 1 of 27 slices shown (2 of 2)]
[im 1/27]
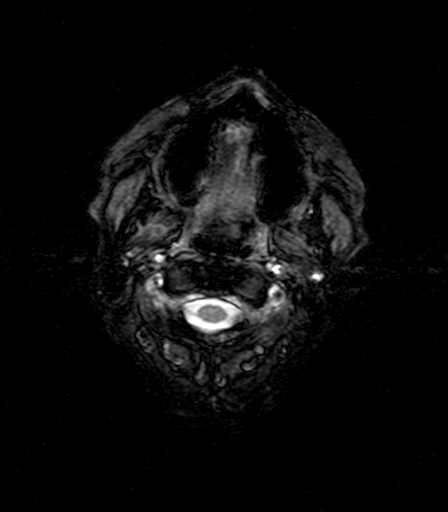

[Series 14: T1 post-contrast · axial · 1.0mm · 0.45mm/px · z∈[-51,+104]mm · 11 of 160 slices shown (1 of 3)]
[im 1/160]
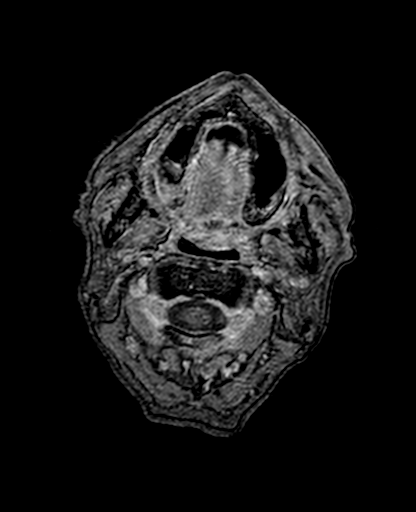
[im 16/160]
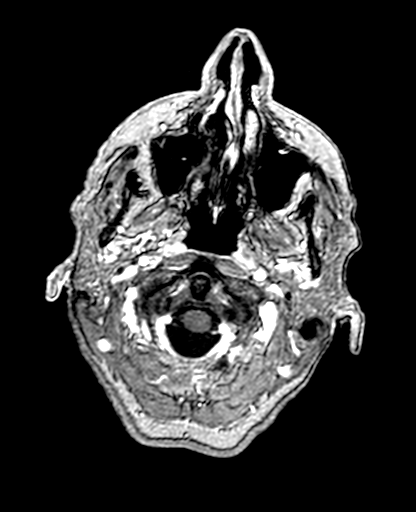
[im 32/160]
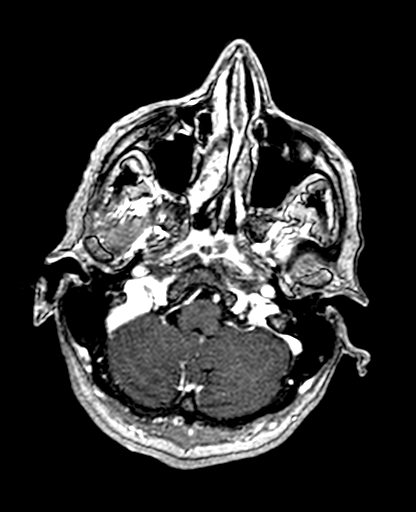
[im 48/160]
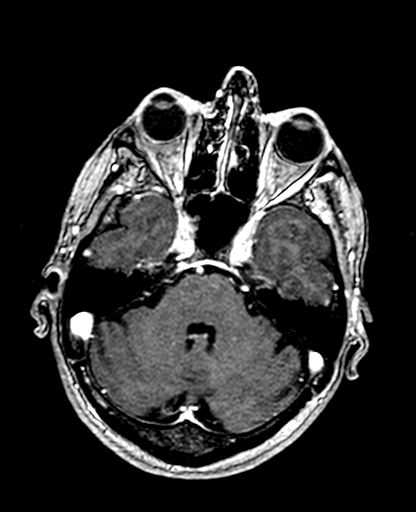
[im 64/160]
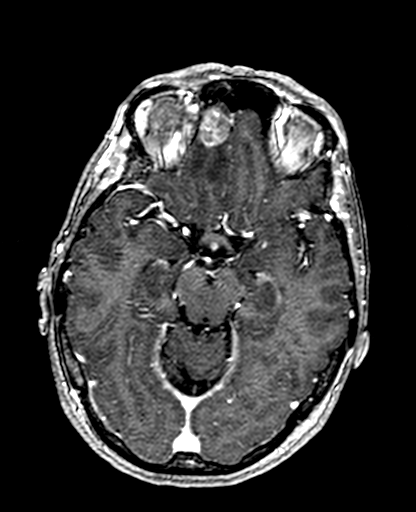
[im 80/160]
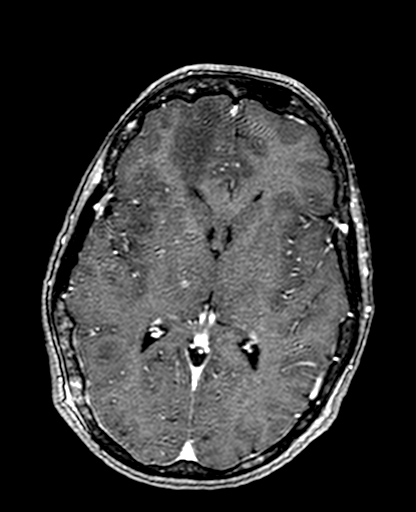
[im 96/160]
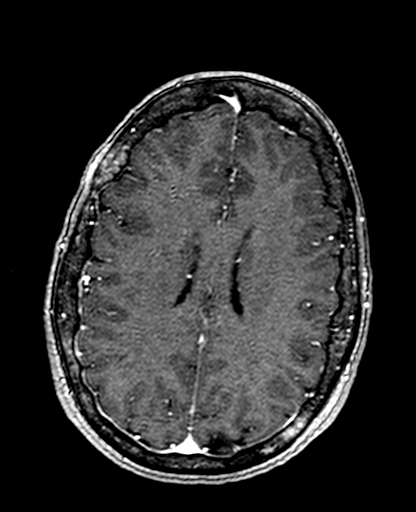
[im 112/160]
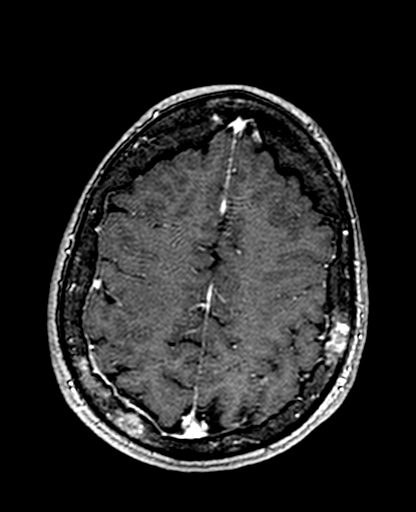
[im 128/160]
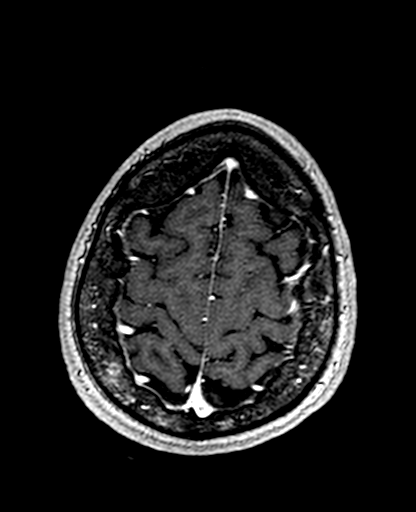
[im 144/160]
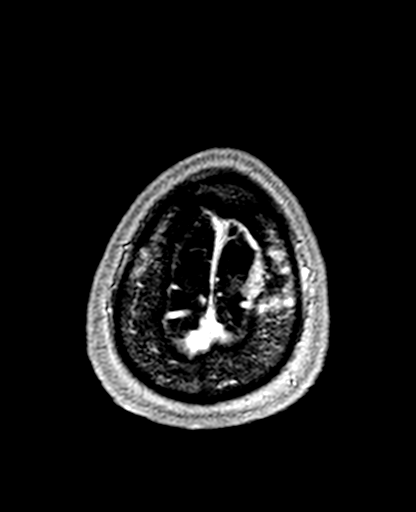
[im 160/160]
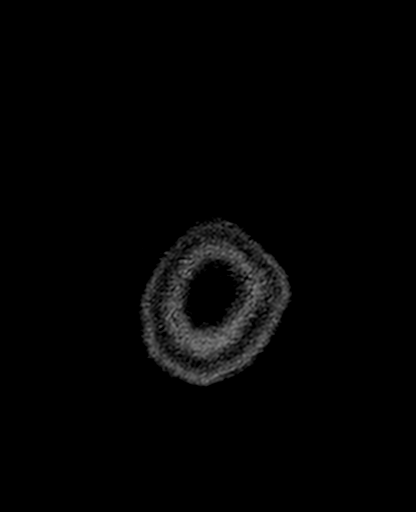

[Series 15: T1 post-contrast · coronal · 5.0mm · 0.45mm/px · 2 of 31 slices shown (2 of 3)]
[im 1/31]
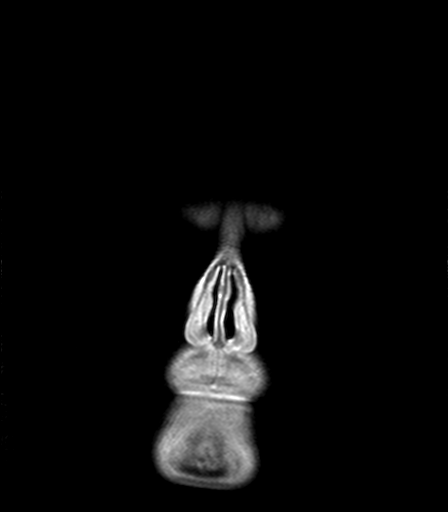
[im 31/31]
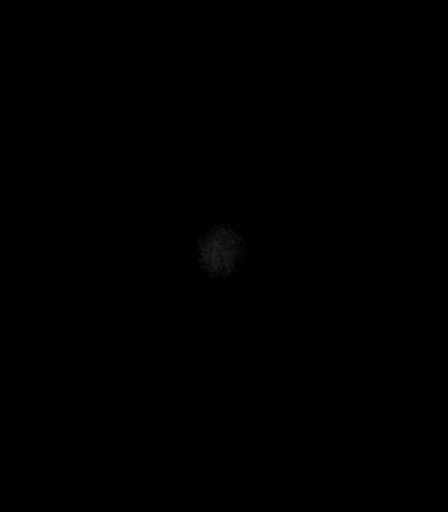

[Series 16: T1 post-contrast · sagittal · 5.0mm · 0.45mm/px · 2 of 23 slices shown (3 of 3)]
[im 1/23]
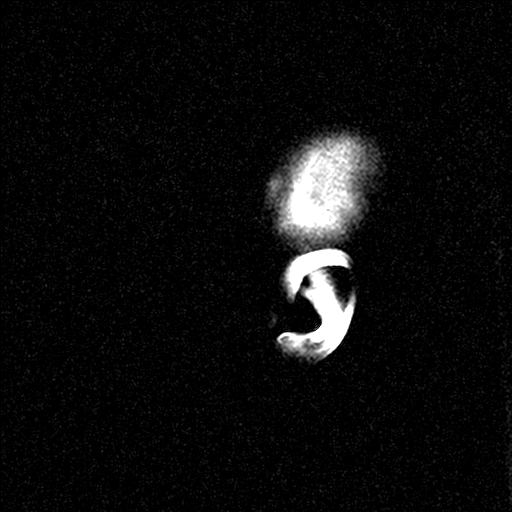
[im 23/23]
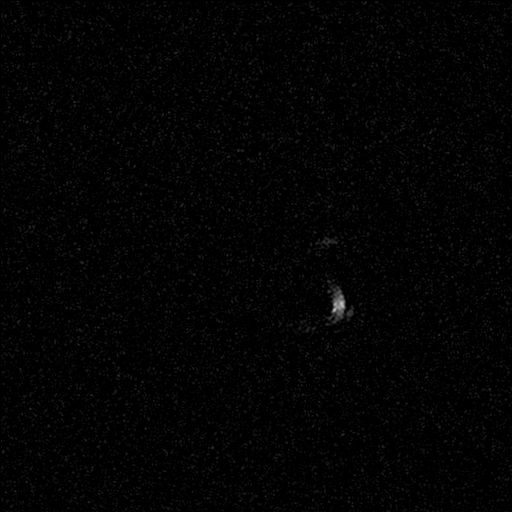

[27 of 48 positions shown; findings below may reference images not displayed]

FINDINGS: Brain: There is a 2 cm heterogeneously enhancing metastasis in the
right anterior gyrus rectus with surrounding vasogenic edema. See
series 14, image 67 and series 10, image 23). Only mild regional
mass effect.

There are than at least 5 additional subcentimeter enhancing
metastases, some of which are only 2-3 mm (e.g. Left basal ganglia
series 14, image 87). The largest of these lesions is in the medial
left posterior frontal lobe on series 14, image 119 measuring 6-7
mm. None of these lesions have mass effect or significant edema.
None of the brain metastases appear hemorrhagic.

Both the pituitary infundibulum and gland itself appear somewhat
thickened and enhancing but this is indeterminate (series 14, image
62 and series 16, image 12).

There is a questionable small abnormal area of dural thickening
along the left frontal convexity on series 14, image 127 and series
16, image 14, but this is not in proximity to the much larger
calvarium metastasis with dural involvement which is described
below.

No restricted diffusion to suggest acute infarction. No midline
shift, ventriculomegaly, extra-axial collection or acute
intracranial hemorrhage. Cervicomedullary junction and pituitary are
within normal limits. No cortical encephalomalacia or chronic
cerebral blood products.

Vascular: Major intracranial vascular flow voids are preserved.

Skull and upper cervical spine: There are numerous low T1 signal
enhancing calvarial metastases, most conspicuous on. The largest of
these seen on series 2 image 16 is expansile up to 2 x 3 cm with
subjacent pachymeningeal involvement in thickening (series 15, image
14). Despite the mild mass effect on the left superior frontal gyrus
there is no associated cerebral edema. The bone metastases
demonstrate abnormal diffusion.

By contrast the bone marrow signal at the skullbase and in the
visible cervical spine is normal. Negative visualized cervical spine
and spinal cord.

Sinuses/Orbits: Orbits soft tissues appear normal. Paranasal sinuses
and mastoids are clear.

Other: Visible internal auditory structures appear normal.

There are occasional abnormal scalp soft tissue nodules like that on
series 13, image 18 which are likely metastatic in light of the
recent body CT appearance.
IMPRESSION: 1. Positive for brain metastases. The largest is a 2 cm right
anterior inferior frontal gyrus metastasis with surrounding cerebral
edema but no significant mass effect. At least 5 additional
subcentimeter brain metastases are identified without edema or mass
effect.
2. Indeterminate metastatic involvement of the pituitary gland.
Attention for any pituitary enlargement on follow-up MRIs would best
evaluate further.
3. Widespread skull metastases, including an expansile 2 x 3 cm left
vertex metastasis with underlying dural involvement. Questionable
tiny dural metastasis also on series 14, image 127.
4. Several small nodular scout metastases are suspected and similar
to those on the recent body CT.

## 2018-07-02 IMAGING — CR DG FOREARM 2V*L*
1 series · 2 of 2 positions shown · non-contrast
Comparison: 09/24/2016

CLINICAL DATA: Metastatic neuroendocrine tumor with multifocal
areas of abnormal bone uptake on prior bone scan.

EXAM:
LEFT FOREARM - 2 VIEW

[Series 1: dg forearm left · 0.14mm/px · 2 of 2 slices shown]
[im 1/2]
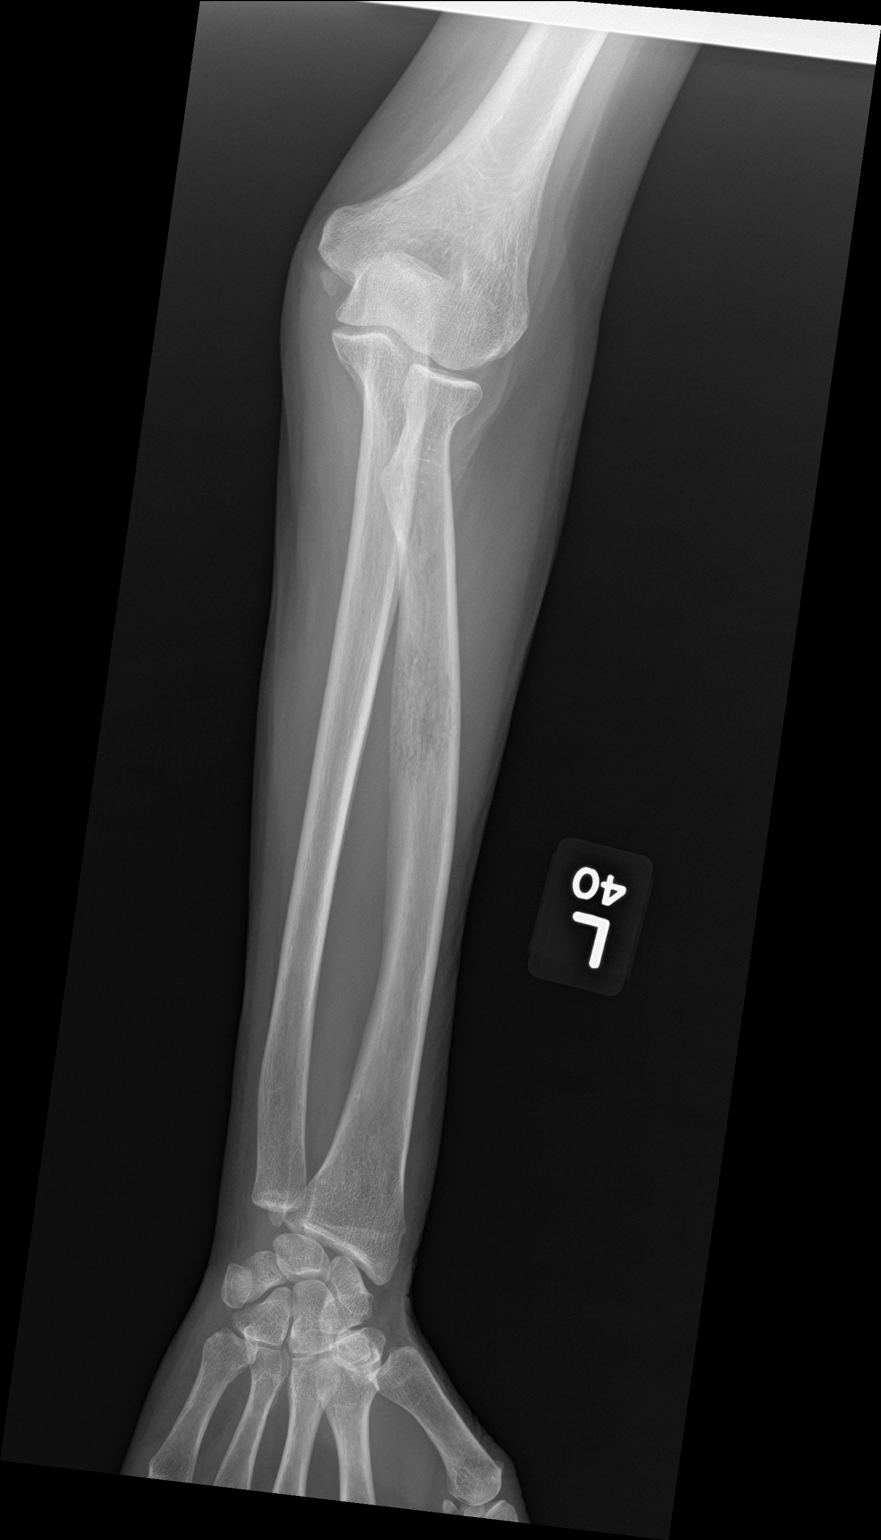
[im 2/2]
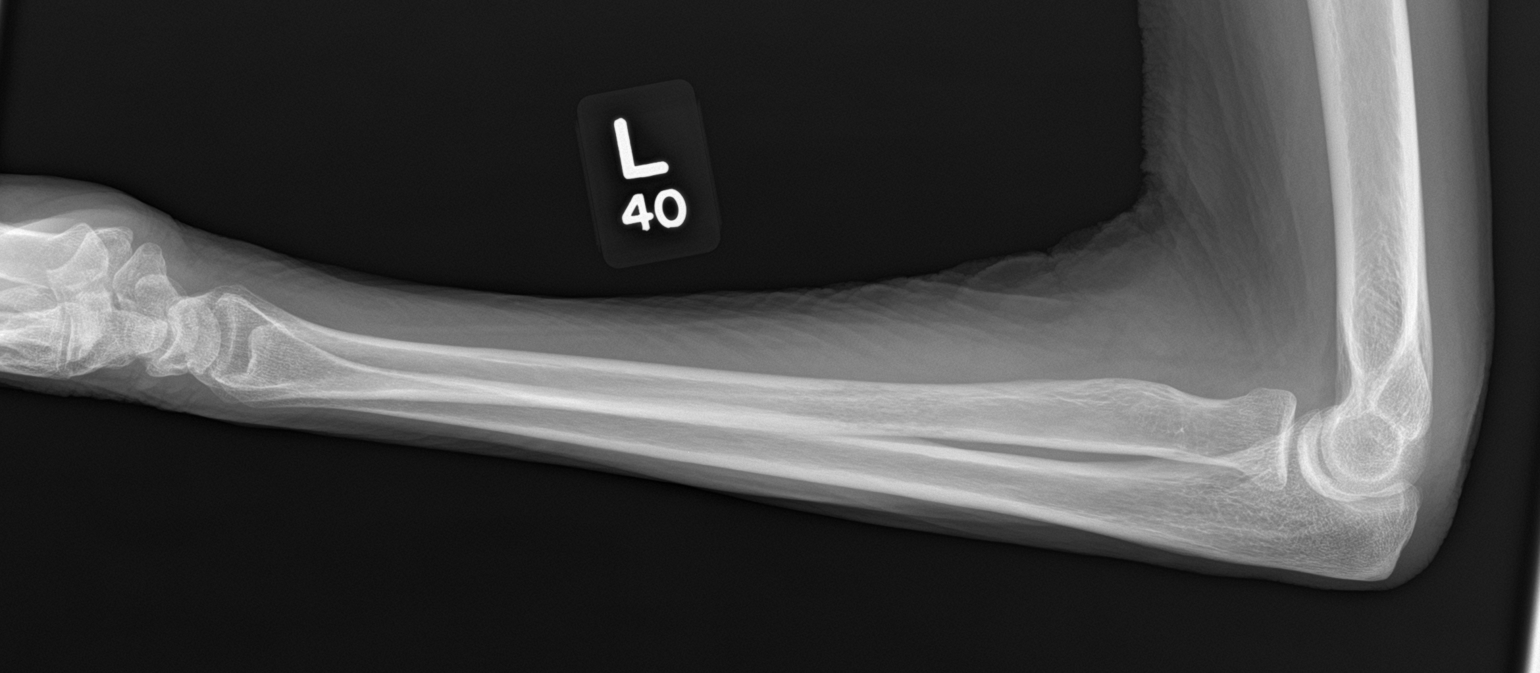

[2 of 2 positions shown; findings below may reference images not displayed]

FINDINGS: There is a ill-defined mottled appearance of the mid left radius
corresponding to the area of abnormal bony uptake on prior bone
scan. Findings concerning for metastatic focus. No fracture.
IMPRESSION: Poorly defined mottled area in the proximal to mid left humeral
shaft concerning for metastasis.

## 2018-07-02 IMAGING — CR DG FEMUR 2+V*L*
1 series · 4 of 4 positions shown · non-contrast
Comparison: Bone scan 09/24/2016.

CLINICAL DATA: History of malignancies.

EXAM:
LEFT FEMUR 2 VIEWS

[Series 1: dg femur min 2 views left · 0.14mm/px · 4 of 4 slices shown]
[im 1/4]
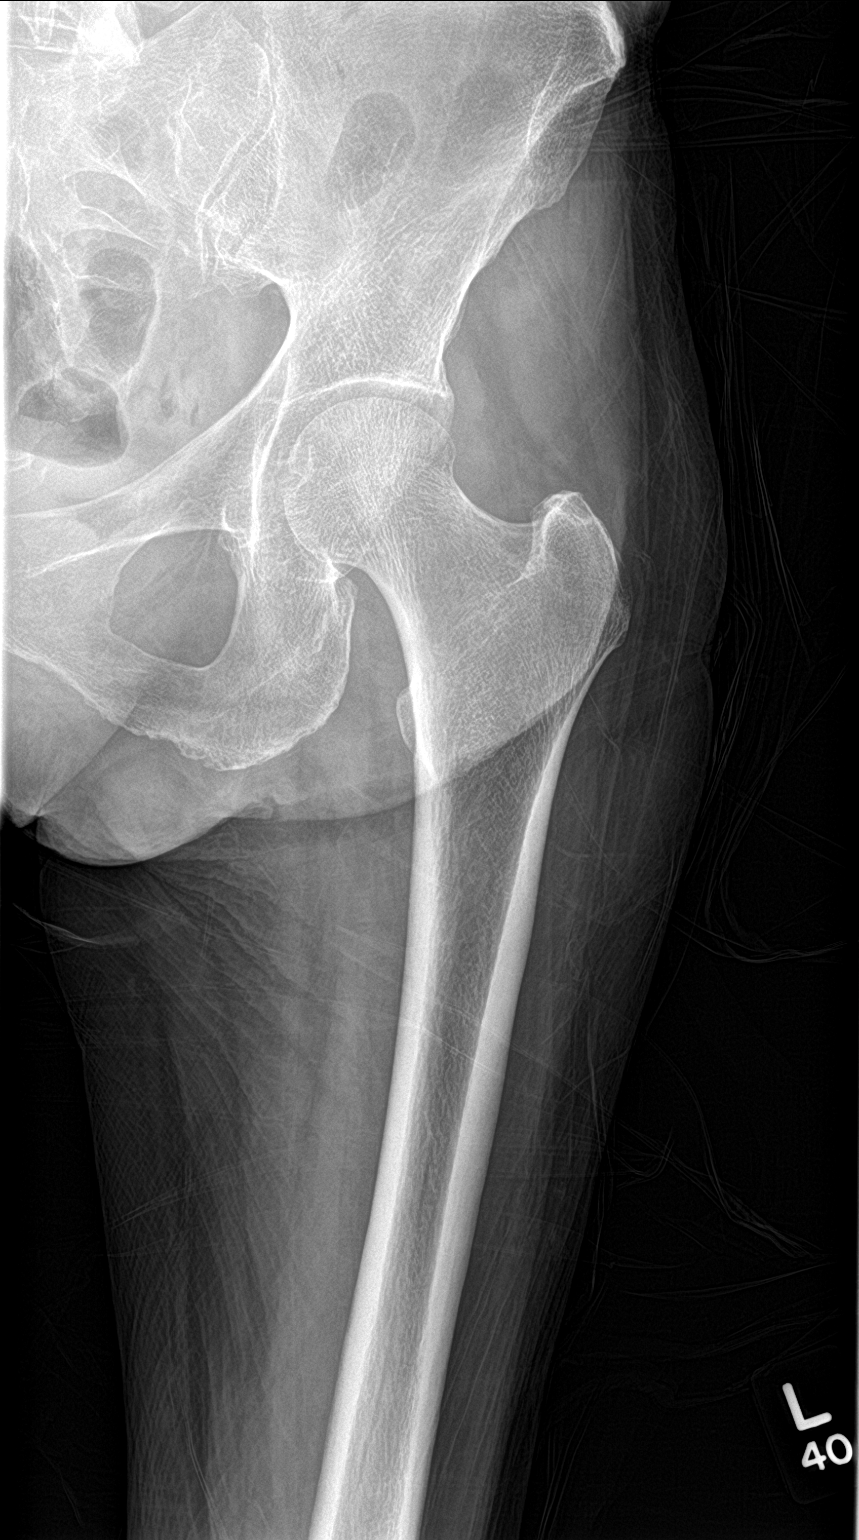
[im 2/4]
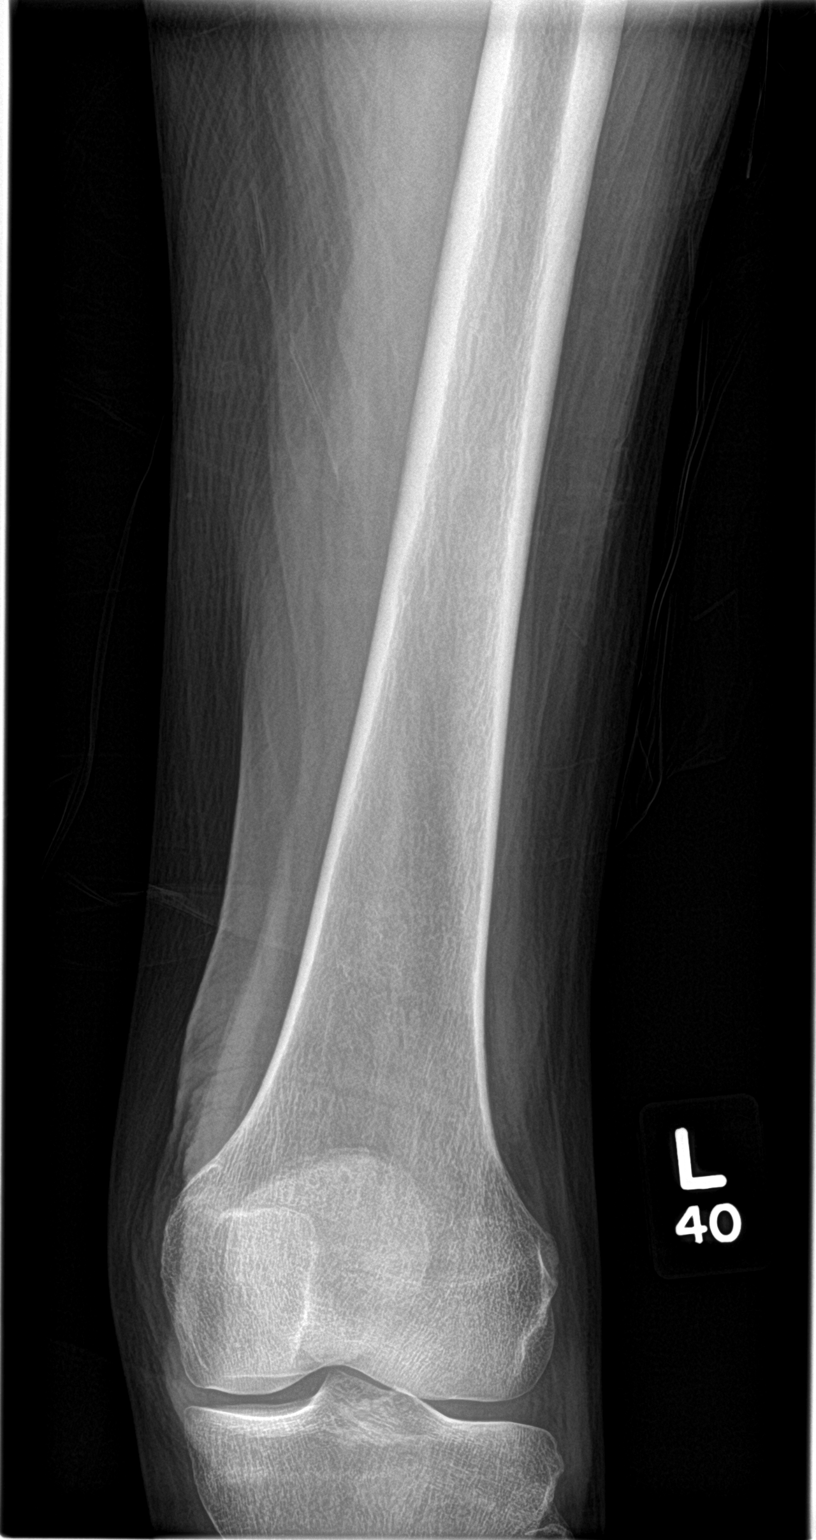
[im 3/4]
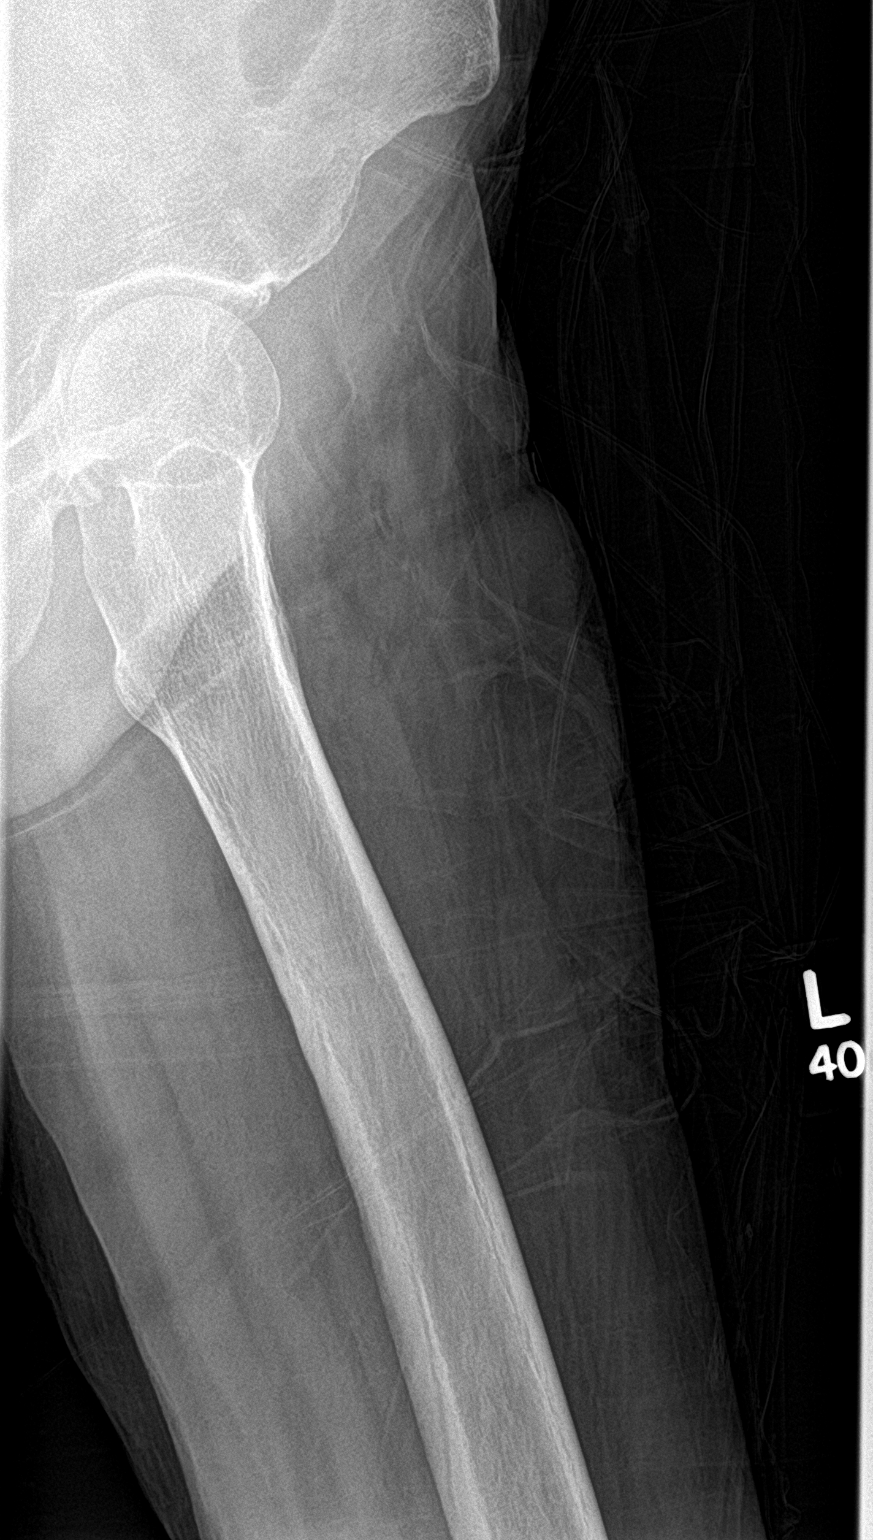
[im 4/4]
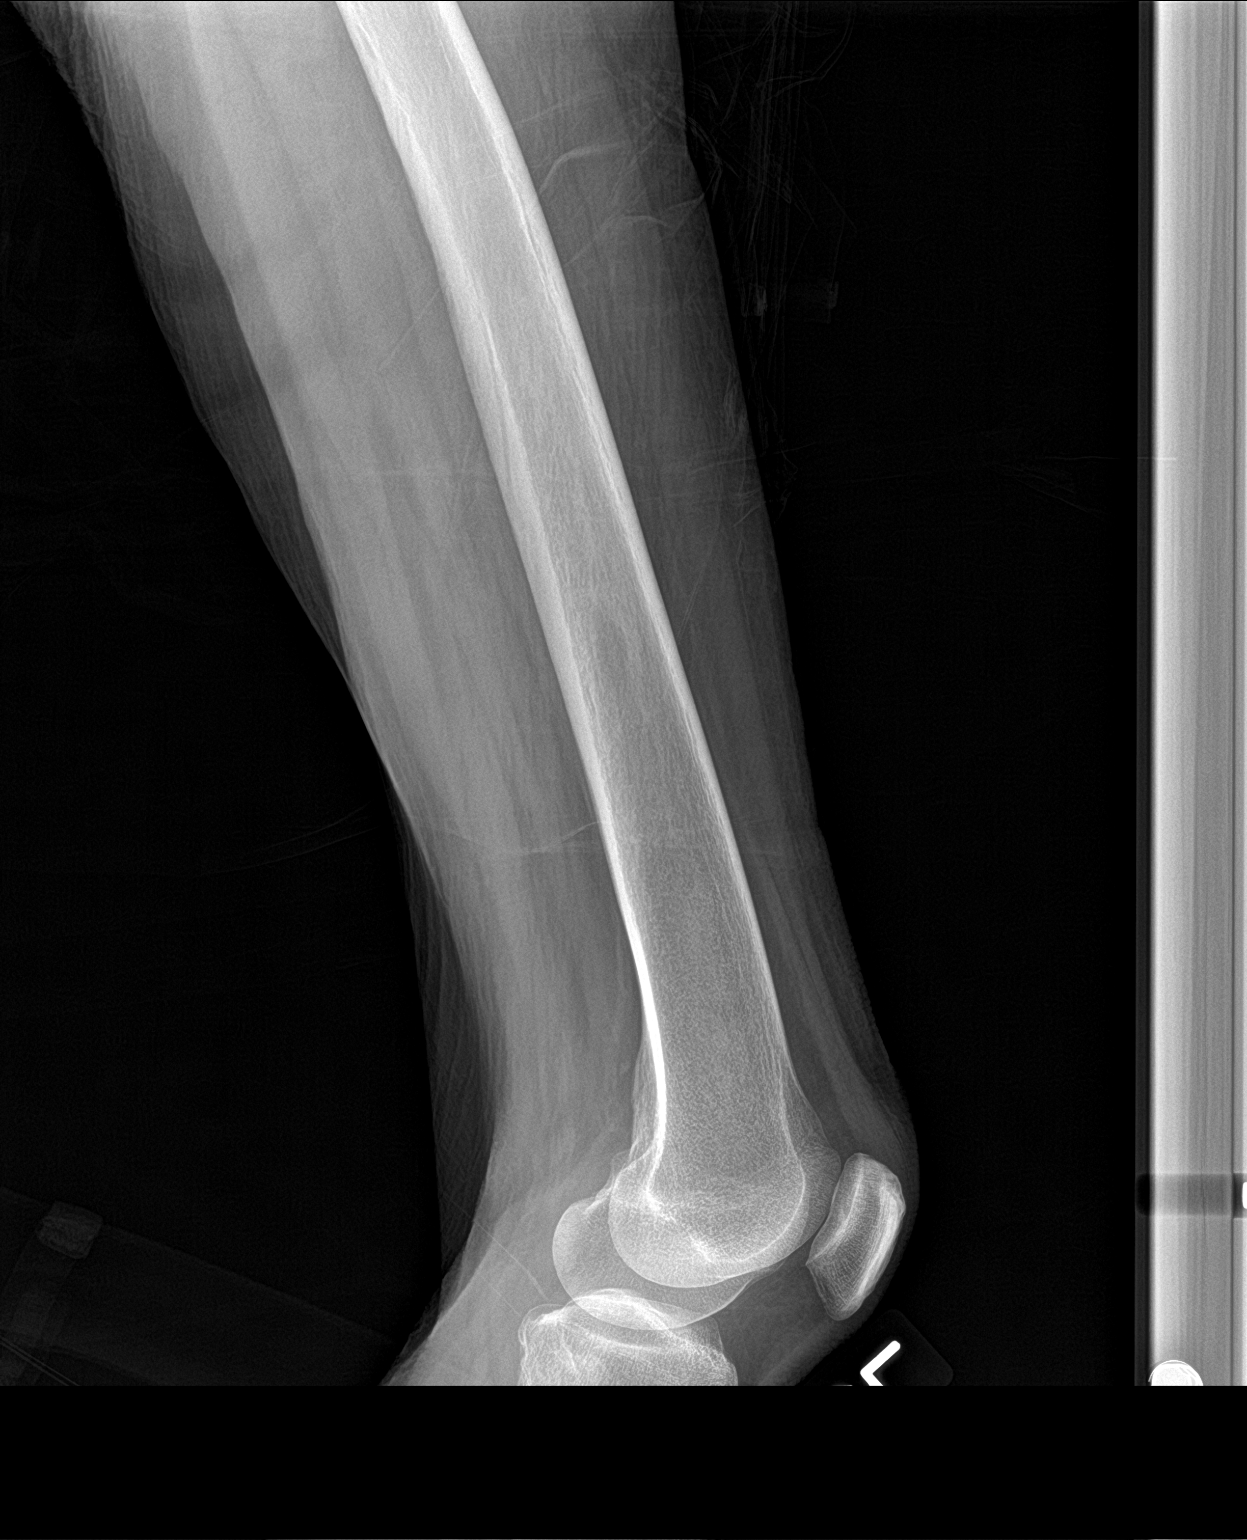

[4 of 4 positions shown; findings below may reference images not displayed]

FINDINGS: Very subtle lucency in the central portion of the left femur in the
region of bone scan abnormality cannot be excluded. No prominent
abnormality identified. There is no evidence of pathologic fracture.
IMPRESSION: Very subtle lucency in the region of previously identified bone scan
abnormality within the mid left femur cannot be excluded. No
prominent focal abnormality identified . No evidence of pathologic
fracture.
